# Patient Record
Sex: Female | Born: 1980 | Race: White | Hispanic: No | Marital: Married | Smoking: Current every day smoker
Health system: Southern US, Community
[De-identification: ages and names within clinical notes are randomized; demographics above are authoritative.]

## PROBLEM LIST (undated history)

## (undated) DIAGNOSIS — E785 Hyperlipidemia, unspecified: Secondary | ICD-10-CM

## (undated) DIAGNOSIS — E119 Type 2 diabetes mellitus without complications: Secondary | ICD-10-CM

## (undated) DIAGNOSIS — E11649 Type 2 diabetes mellitus with hypoglycemia without coma: Secondary | ICD-10-CM

## (undated) DIAGNOSIS — I10 Essential (primary) hypertension: Secondary | ICD-10-CM

---

## 2009-02-10 HISTORY — PX: TYMPANOSTOMY TUBE PLACEMENT: SHX32

## 2014-10-22 ENCOUNTER — Emergency Department (HOSPITAL_COMMUNITY): Payer: Medicaid Other

## 2014-10-22 ENCOUNTER — Encounter (HOSPITAL_COMMUNITY): Payer: Self-pay

## 2014-10-22 ENCOUNTER — Emergency Department (HOSPITAL_COMMUNITY)
Admission: EM | Admit: 2014-10-22 | Discharge: 2014-10-22 | Disposition: A | Discharge status: Home / Self Care | Payer: Medicaid Other | Attending: Emergency Medicine | Admitting: Emergency Medicine

## 2014-10-22 DIAGNOSIS — R9431 Abnormal electrocardiogram [ECG] [EKG]: Secondary | ICD-10-CM

## 2014-10-22 DIAGNOSIS — Z7982 Long term (current) use of aspirin: Secondary | ICD-10-CM | POA: Insufficient documentation

## 2014-10-22 DIAGNOSIS — E119 Type 2 diabetes mellitus without complications: Secondary | ICD-10-CM | POA: Insufficient documentation

## 2014-10-22 DIAGNOSIS — R079 Chest pain, unspecified: Secondary | ICD-10-CM | POA: Diagnosis not present

## 2014-10-22 DIAGNOSIS — I10 Essential (primary) hypertension: Secondary | ICD-10-CM | POA: Diagnosis not present

## 2014-10-22 DIAGNOSIS — R0602 Shortness of breath: Secondary | ICD-10-CM | POA: Insufficient documentation

## 2014-10-22 DIAGNOSIS — Z720 Tobacco use: Secondary | ICD-10-CM | POA: Insufficient documentation

## 2014-10-22 DIAGNOSIS — R0789 Other chest pain: Secondary | ICD-10-CM

## 2014-10-22 DIAGNOSIS — R42 Dizziness and giddiness: Secondary | ICD-10-CM | POA: Insufficient documentation

## 2014-10-22 HISTORY — DX: Essential (primary) hypertension: I10

## 2014-10-22 HISTORY — DX: Type 2 diabetes mellitus with hypoglycemia without coma: E11.649

## 2014-10-22 LAB — BASIC METABOLIC PANEL
ANION GAP: 12 (ref 5–15)
BUN: 13 mg/dL (ref 6–20)
CHLORIDE: 102 mmol/L (ref 101–111)
CO2: 24 mmol/L (ref 22–32)
CREATININE: 0.82 mg/dL (ref 0.44–1.00)
Calcium: 9.4 mg/dL (ref 8.9–10.3)
GFR calc non Af Amer: >60 mL/min (ref >60)
Glucose, Bld: 153 mg/dL — ABNORMAL HIGH (ref 65–99)
Potassium: 3.4 mmol/L — ABNORMAL LOW (ref 3.5–5.1)
SODIUM: 138 mmol/L (ref 135–145)

## 2014-10-22 LAB — CBC
HCT: 46.9 % — ABNORMAL HIGH (ref 36.0–46.0)
HEMOGLOBIN: 16.1 g/dL — AB (ref 12.0–15.0)
MCH: 31.4 pg (ref 26.0–34.0)
MCHC: 34.3 g/dL (ref 30.0–36.0)
MCV: 91.4 fL (ref 78.0–100.0)
PLATELETS: 323 10*3/uL (ref 150–400)
RBC: 5.13 MIL/uL — AB (ref 3.87–5.11)
RDW: 12.6 % (ref 11.5–15.5)
WBC: 11.9 10*3/uL — AB (ref 4.0–10.5)

## 2014-10-22 LAB — I-STAT TROPONIN, ED
TROPONIN I, POC: 0 ng/mL (ref 0.00–0.08)
TROPONIN I, POC: 0 ng/mL (ref 0.00–0.08)

## 2014-10-22 LAB — CBG MONITORING, ED: Glucose-Capillary: 149 mg/dL — ABNORMAL HIGH (ref 65–99)

## 2014-10-22 NOTE — ED Notes (Signed)
Patient transported to xray as soon as she arrived to room.
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
## 2014-10-22 NOTE — ED Notes (Signed)
Onset 20 minutes PTA pain at left scapula radiating through to left chest pain, diaphoretic, short of breath and nausea.  Pt has type two hypoglycemia and thought her blood sugar was dropping so she ate a candy bar.  Pt has never had chest pain with her hypoglycemia;.

## 2014-10-22 NOTE — Discharge Instructions (Signed)

## 2014-10-22 NOTE — ED Notes (Signed)
Cardiology at bedside.

## 2014-10-22 NOTE — ED Provider Notes (Signed)
CSN: [id number]     Arrival date & time 10/22/14  1211 History   First MD Initiated Contact with Patient 10/22/14 1324     Chief Complaint  Patient presents with  . Chest Pain   HPI  Ms. [place] is a 34 year old female with PMHx of DM2 and HTN presenting with chest pain. Pt states she was visiting her son who is currently in the PICU when she felt lightheaded, nauseous and diaphoretic. She thought she was having a hypoglycemic episode so she ate a candy bar without resolution of symptoms. Pt then reports onset of chest pain that radiated through to her back with associated shortness of breath. Pt states she feels like "she's being punched straight through the chest". Pain is intermittent in severity and increases with deep inspiration and movement. Denies current SOB. Pt also complaining of left arm numbness and tingling that radiates down to the fingertips and up into the neck. Pt states she had been seeing a cardiologist in [place] for "abnormal EKGs" and was scheduled for a stress test but never went. She is unsure what was abnormal about her EKGs. Her mother died of "heart disease" in her 85s.   Past Medical History  Diagnosis Date  . Type 2 diabetes mellitus with hypoglycemia   . Hypertension    History reviewed. No pertinent past surgical history. History reviewed. No pertinent family history. Social History  Substance Use Topics  . Smoking status: Current Every Day Smoker -- 0.50 packs/day    Types: Cigarettes  . Smokeless tobacco: None  . Alcohol Use: No   OB History    No data available     Review of Systems  Constitutional: Positive for diaphoresis. Negative for fever and chills.  Respiratory: Positive for shortness of breath.   Cardiovascular: Positive for chest pain.  Gastrointestinal: Positive for nausea. Negative for vomiting and abdominal pain.  Musculoskeletal: Negative for myalgias and arthralgias.  Neurological: Positive for dizziness, weakness, light-headedness  and numbness. Negative for syncope and headaches.      Allergies  Tomato  Home Medications   Prior to Admission medications   Medication Sig Start Date End Date Taking? Authorizing Provider  Artificial Tear Ointment (DRY EYES OP) Apply 1 drop to eye 4 (four) times daily.   Yes Historical Provider, MD  aspirin-acetaminophen-caffeine (EXCEDRIN MIGRAINE) [phone] MG per tablet Take 3 tablets by mouth every 4 (four) hours as needed for migraine.   Yes Historical Provider, MD  HYDROcodone-acetaminophen (NORCO) 10-325 MG per tablet Take 1 tablet by mouth every 6 (six) hours as needed for moderate pain.   Yes Historical Provider, MD  oxymetazoline (AFRIN) 0.05 % nasal spray Place 2 sprays into both nostrils 2 (two) times daily.   Yes Historical Provider, MD   BP 125/82 mmHg  Pulse 70  Temp(Src) 97.9 F (36.6 C) (Oral)  Resp 9  Ht  (1.676 m)  Wt 153 lb 4.8 oz (69.536 kg)  BMI 24.75 kg/m2  SpO2 99%  LMP 09/20/2014 Physical Exam  Constitutional: She is oriented to person, place, and time. She appears well-developed and well-nourished. No distress.  HENT:  Head: Normocephalic.  Neck: Normal range of motion.  Cardiovascular: Normal rate, regular rhythm and normal heart sounds.   No murmur heard. Pedal pulses palpable Cap refill < 3   Pulmonary/Chest: Effort normal and breath sounds normal. No respiratory distress. She has no wheezes. She has no rales.  Abdominal: Soft. There is no tenderness.  Musculoskeletal: Normal range of motion.  Neurological: She is alert and oriented to person, place, and time.  5/5 motor strength of upper and lower extremities. Sensation to light touch intact throughout.   Skin: Skin is warm and dry.  Psychiatric: She has a normal mood and affect. Her behavior is normal.  Nursing note and vitals reviewed.   ED Course  Procedures (including critical care time) Labs Review Labs Reviewed  BASIC METABOLIC PANEL - Abnormal; Notable for the following:     Potassium 3.4 (*)    Glucose, Bld 153 (*)    All other components within normal limits  CBC - Abnormal; Notable for the following:    WBC 11.9 (*)    RBC 5.13 (*)    Hemoglobin 16.1 (*)    HCT 46.9 (*)    All other components within normal limits  CBG MONITORING, ED - Abnormal; Notable for the following:    Glucose-Capillary 149 (*)    All other components within normal limits  CBG MONITORING, ED  I-STAT TROPOININ, ED  Rosezena Sensor, ED    Imaging Review Dg Chest 2 View  10/22/2014   CLINICAL DATA:  Midsternal and left-sided chest pain.  EXAM: CHEST  2 VIEW  COMPARISON:  None.  FINDINGS: The heart size and mediastinal contours are within normal limits. Both lungs are clear. The visualized skeletal structures are unremarkable.  IMPRESSION: No active cardiopulmonary disease.   Electronically Signed   By: Elsie Stain M.D.   On: 10/22/2014 14:04   I have personally reviewed and evaluated these images and lab results as part of my medical decision-making.   EKG Interpretation   Date/Time:  Sunday October 22 2014 12:16:48 EDT Ventricular Rate:  107 PR Interval:  118 QRS Duration: 94 QT Interval:  358 QTC Calculation: 477 R Axis:   85 Text Interpretation:  Sinus tachycardia Right atrial enlargement Cannot  rule out Inferior infarct , age undetermined Baseline wander No old  tracing to compare Confirmed by [facility]  MD, Nicholos Johns [phone]) on 10/22/2014  1:39:45 PM      MDM   Final diagnoses:  Chest pain, unspecified chest pain type   Pt presenting with chest pain. Pain associated with nausea, diaphoresis, SOB and lightheadedness. Pt thought she was having hypoglycemic event and ate candy bar with no resolution in symptoms. Pt reports resolution of almost all symptoms except for chest pain. She says it is not as severe, she feels "sore". VSS. Pt nontoxic on exam. Heart RRR. Lungs CTAB. Answers questions appropriately. CXR normal. EKG shows possible old inferior infarct.  Consulted cardiology who saw her in ED. Dr. Patty Sermons believes CP is manifestation of pt's hypoglycemia. Recommends repeat troponin 3 hours after original draw and outpatient follow up for echo. Pt repeat troponin 0.00. Pt given referral information for [facility] and instructed to call and schedule appointment. Return precautions given in discharge paperwork and discussed with pt. Pt stable for discharge.    Alveta Heimlich, PA-C 10/22/14 1851  Samuel Jester, DO 10/25/14 1635

## 2014-10-22 NOTE — ED Notes (Signed)
Pt was in hospital when chest pain started, her son is in ICU.

## 2014-11-05 ENCOUNTER — Emergency Department (HOSPITAL_COMMUNITY): Payer: Medicaid Other

## 2014-11-05 ENCOUNTER — Encounter (HOSPITAL_COMMUNITY): Payer: Self-pay | Admitting: *Deleted

## 2014-11-05 ENCOUNTER — Emergency Department (HOSPITAL_COMMUNITY)
Admission: EM | Admit: 2014-11-05 | Discharge: 2014-11-05 | Disposition: A | Discharge status: Home / Self Care | Payer: Medicaid Other | Attending: Emergency Medicine | Admitting: Emergency Medicine

## 2014-11-05 DIAGNOSIS — R630 Anorexia: Secondary | ICD-10-CM | POA: Diagnosis not present

## 2014-11-05 DIAGNOSIS — I10 Essential (primary) hypertension: Secondary | ICD-10-CM | POA: Insufficient documentation

## 2014-11-05 DIAGNOSIS — Z79899 Other long term (current) drug therapy: Secondary | ICD-10-CM | POA: Insufficient documentation

## 2014-11-05 DIAGNOSIS — Z3202 Encounter for pregnancy test, result negative: Secondary | ICD-10-CM | POA: Insufficient documentation

## 2014-11-05 DIAGNOSIS — R1011 Right upper quadrant pain: Secondary | ICD-10-CM | POA: Insufficient documentation

## 2014-11-05 DIAGNOSIS — E119 Type 2 diabetes mellitus without complications: Secondary | ICD-10-CM | POA: Insufficient documentation

## 2014-11-05 DIAGNOSIS — Z720 Tobacco use: Secondary | ICD-10-CM | POA: Insufficient documentation

## 2014-11-05 LAB — CBC WITH DIFFERENTIAL/PLATELET
BASOS PCT: 0 %
Basophils Absolute: 0 10*3/uL (ref 0.0–0.1)
EOS ABS: 0.2 10*3/uL (ref 0.0–0.7)
Eosinophils Relative: 2 %
HEMATOCRIT: 45.9 % (ref 36.0–46.0)
HEMOGLOBIN: 15.5 g/dL — AB (ref 12.0–15.0)
Lymphocytes Relative: 23 %
Lymphs Abs: 3.2 10*3/uL (ref 0.7–4.0)
MCH: 31.1 pg (ref 26.0–34.0)
MCHC: 33.8 g/dL (ref 30.0–36.0)
MCV: 92 fL (ref 78.0–100.0)
MONOS PCT: 6 %
Monocytes Absolute: 0.8 10*3/uL (ref 0.1–1.0)
NEUTROS ABS: 9.5 10*3/uL — AB (ref 1.7–7.7)
NEUTROS PCT: 69 %
Platelets: 303 10*3/uL (ref 150–400)
RBC: 4.99 MIL/uL (ref 3.87–5.11)
RDW: 12.7 % (ref 11.5–15.5)
WBC: 13.8 10*3/uL — AB (ref 4.0–10.5)

## 2014-11-05 LAB — COMPREHENSIVE METABOLIC PANEL
ALBUMIN: 4.1 g/dL (ref 3.5–5.0)
ALK PHOS: 56 U/L (ref 38–126)
ALT: 30 U/L (ref 14–54)
AST: 18 U/L (ref 15–41)
Anion gap: 4 — ABNORMAL LOW (ref 5–15)
BILIRUBIN TOTAL: 0.3 mg/dL (ref 0.3–1.2)
BUN: 18 mg/dL (ref 6–20)
CALCIUM: 8.7 mg/dL — AB (ref 8.9–10.3)
CO2: 26 mmol/L (ref 22–32)
CREATININE: 0.59 mg/dL (ref 0.44–1.00)
Chloride: 111 mmol/L (ref 101–111)
GFR calc Af Amer: >60 mL/min (ref >60)
GFR calc non Af Amer: >60 mL/min (ref >60)
GLUCOSE: 84 mg/dL (ref 65–99)
Potassium: 4 mmol/L (ref 3.5–5.1)
SODIUM: 141 mmol/L (ref 135–145)
TOTAL PROTEIN: 7.5 g/dL (ref 6.5–8.1)

## 2014-11-05 LAB — URINALYSIS, ROUTINE W REFLEX MICROSCOPIC
BILIRUBIN URINE: NEGATIVE
GLUCOSE, UA: NEGATIVE mg/dL
Ketones, ur: NEGATIVE mg/dL
Leukocytes, UA: NEGATIVE
Nitrite: NEGATIVE
PH: 5.5 (ref 5.0–8.0)
Protein, ur: NEGATIVE mg/dL
SPECIFIC GRAVITY, URINE: 1.02 (ref 1.005–1.030)
UROBILINOGEN UA: 0.2 mg/dL (ref 0.0–1.0)

## 2014-11-05 LAB — URINE MICROSCOPIC-ADD ON

## 2014-11-05 LAB — LIPASE, BLOOD: Lipase: 26 U/L (ref 22–51)

## 2014-11-05 LAB — POC URINE PREG, ED: PREG TEST UR: NEGATIVE

## 2014-11-05 MED ORDER — SODIUM CHLORIDE 0.9 % IV BOLUS (SEPSIS)
1000.0000 mL | Freq: Once | INTRAVENOUS | Status: AC
  Administered 2014-11-05: 1000 mL via INTRAVENOUS

## 2014-11-05 MED ORDER — MORPHINE SULFATE (PF) 4 MG/ML IV SOLN
4.0000 mg | Freq: Once | INTRAVENOUS | Status: AC
  Administered 2014-11-05: 4 mg via INTRAVENOUS
  Filled 2014-11-05: qty 1

## 2014-11-05 MED ORDER — HYDROCODONE-ACETAMINOPHEN 5-325 MG PO TABS: 1.0000 | ORAL_TABLET | ORAL | Status: DC | PRN

## 2014-11-05 MED ORDER — ONDANSETRON HCL 4 MG/2ML IJ SOLN
4.0000 mg | Freq: Once | INTRAMUSCULAR | Status: AC
  Administered 2014-11-05: 4 mg via INTRAVENOUS
  Filled 2014-11-05: qty 2

## 2014-11-05 MED ORDER — PROMETHAZINE HCL 25 MG PO TABS
25.0000 mg | ORAL_TABLET | Freq: Four times a day (QID) | ORAL | Status: DC | PRN
Start: 2014-11-05 — End: 2015-06-16

## 2014-11-05 MED ORDER — IOHEXOL 300 MG/ML  SOLN
25.0000 mL | Freq: Once | INTRAMUSCULAR | Status: AC | PRN
  Administered 2014-11-05: 25 mL via ORAL

## 2014-11-05 MED ORDER — IOHEXOL 300 MG/ML  SOLN
100.0000 mL | Freq: Once | INTRAMUSCULAR | Status: AC | PRN
  Administered 2014-11-05: 100 mL via INTRAVENOUS

## 2014-11-05 MED ORDER — IOHEXOL 300 MG/ML  SOLN: 25.0000 mL | Freq: Once | INTRAMUSCULAR | Status: DC | PRN

## 2014-11-05 NOTE — ED Notes (Signed)
RUQ pain x 1 week. Pain is worse after eating. Nausea.

## 2014-11-05 NOTE — Discharge Instructions (Signed)
We will schedule gallbladder ultrasound for tomorrow. Medication for pain and nausea. Avoid rich greasy foods. Need to find a primary care physician. Resource guide given.    Emergency Department Resource Guide 1) Find a Doctor and Pay Out of Pocket Although you won't have to find out who is covered by your insurance plan, it is a good idea to ask around and get recommendations. You will then need to call the office and see if the doctor you have chosen will accept you as a new patient and what types of options they offer for patients who are self-pay. Some doctors offer discounts or will set up payment plans for their patients who do not have insurance, but you will need to ask so you aren't surprised when you get to your appointment.  2) Contact Your Local Health Department Not all health departments have doctors that can see patients for sick visits, but many do, so it is worth a call to see if yours does. If you don't know where your local health department is, you can check in your phone book. The CDC also has a tool to help you locate your state's health department, and many state websites also have listings of all of their local health departments.  3) Find a Walk-in Clinic If your illness is not likely to be very severe or complicated, you may want to try a walk in clinic. These are popping up all over the country in pharmacies, drugstores, and shopping centers. They're usually staffed by nurse practitioners or physician assistants that have been trained to treat common illnesses and complaints. They're usually fairly quick and inexpensive. However, if you have serious medical issues or chronic medical problems, these are probably not your best option.  No Primary Care Doctor: - Call Health Connect at  [phone] - they can help you locate a primary care doctor that  accepts your insurance, provides certain services, etc. - Physician Referral Service- [phone]  Chronic Pain  Problems: Organization         Address  Phone   Notes  [facility]  [phone] Patients need to be referred by their primary care doctor.   Medication Assistance: Organization         Address  Phone   Notes  [facility] Medication [facility] [address] [place], [place] [address] [phone] --Must be a resident of [facility] -- Must have NO insurance coverage whatsoever (no Medicaid/ Medicare, etc.) -- The pt. MUST have a primary care doctor that directs their care regularly and follows them in the community   [facility]  [phone]   [employer]  [phone]    Agencies that provide inexpensive medical care: Organization         Address  Phone   Notes  [facility]  [phone]   [facility]    [phone]   [facility] [address] [place], [place] [address] [phone]   [facility] [address] [place], [place] [phone]   [facility]anned Parenthood    [phone]   [facility]    [phone]   [facility]  [address], [place] Phone:  [phone], Fax:  [phone] Hours of Operation:  9 am - 6 pm, M-F.  Also accepts Medicaid/Medicare and self-pay.  [facility]  [address], [place] Phone: [phone], Fax: [phone]. Hours of Operation:  8:30 am - 5:30 pm, M-F.  Also accepts Medicaid and self-pay.  [facility] [place] [address] [place] Phone: [phone]   [place], [place], [place] [phone], Ext. [phone] Mondays & Thursdays: 7-9 AM.  First 15 patients are seen on a first come, first serve basis.    [place] Providers:  Organization         Address  Phone   Notes  [facility] [address], [place] [phone] Also  accepts self-pay patients.  [facility] [address], [place]  [phone]   [place], [address], [place] [phone]   [facility] [address] [place] [phone]   Lucianne Lei [address], [place]   [phone] Only accepts [place] Access [place] patients after they have their name applied to their card.   Self-Pay (no insurance) in [facility]:  Organization         Address  Phone   Notes  Sickle Cell Patients, [facility] [place] [phone]   [facility] [place] [phone]   [facility] [place]  [place], [place], [place] [phone]   [facility]/Dr. Osei-Bonsu  [address] [place] or [address], [place] [phone] Phone number for both [place] and [place] locations is the same.  [facility] [address] [place] [phone]   [facility] [address] [place] or [address] [phone]   [facility] [address] [place] [phone], phone; [phone], fax Sees patients 1st and 3rd Saturday of every month.  Must not qualify for public or private insurance (i.e. Medicaid, Medicare, [place] Health Choice, Veterans' Benefits)  Household income should be no more than 200% of the poverty level The clinic cannot treat you if you are pregnant or think you are pregnant  Sexually transmitted diseases are not treated at the clinic.    Dental Care: Organization         Address  Phone  Notes  [facility] [phone] Accepts children up to age 80 who are enrolled in [place] or [place]; pregnant  women with a Medicaid card; and children who have applied for Medicaid or [place] Health Choice, but were declined, whose parents can pay a reduced fee at time of service.  [facility] Department of [facility]  [address], [place] [phone] Accepts children up to age 55 who are enrolled in [place] or [place]; pregnant women with a Medicaid card; and children who have applied for Medicaid or [place] Health Choice, but were declined, whose parents can pay a reduced fee at time of service.  [facility]  [place] [phone] Patients are seen by appointment only. Walk-ins are not accepted. [place] will see patients 4 years of age and older. Monday - Tuesday (8am-5pm) Most Wednesdays (8:30-5pm) $30 per visit, cash only  [place]  [address], [place] [phone] Patients are seen by appointment only. Walk-ins are not accepted. [facility] will see patients 18 years of age and older. °One Wednesday Evening (Monthly: Volunteer Based).  $30 per visit, cash only  °[facility]  [phone] for adults; Children under age 4, call Graduate Pediatric Dentistry at [phone]. Children aged 4-14, please call [phone] to request a pediatric application. ° Dental services are provided in all areas of dental care including fillings, crowns and bridges, complete and partial dentures, implants, gum treatment, root canals, and extractions. Preventive care is also provided. Treatment is provided to both adults and children. °Patients are selected via a lottery and there is often a waiting list. °  °[facility] [address], °[place] ° ([phone] [email or web address] °  °[facility] [address], [place], [place] [phone], Ext. [phone] Second and Fourth Thursday of each month, opens at 6:30 AM; Clinic ends at 9 AM.  Patients are  seen on a first-come first-served basis, and a limited number are seen during each clinic.  ° °[facility] ° [address], [place], [place] [phone]   Eligibility Requirements °You must have lived in [place], [place], or [place] counties for at least the last three months. °  You cannot be eligible for state or federal sponsored healthcare insurance, including Veterans Administration, Medicaid, or Medicare. °  You generally cannot be eligible for healthcare insurance through your employer.  °  How to apply: °Eligibility screenings are held every Tuesday and Wednesday afternoon from 1:00 pm until 4:00 pm. You do not need an appointment for the interview!  °[facility] [address], [place], [place] [phone]   °[facility]  [phone]   °[facility]  [phone]   °[facility]  336-570-6415   ° °Behavioral Health Resources in the Community: °Intensive Outpatient Programs °Organization         Address  Phone  Notes  °[facility] [address], [place], [place] [phone]   °[facility] [address], [place], [place] [phone]   °[facility] [address], [place], [place] ° 336-882-2125   °[facility] [address],  °[place], [place] 1-800-853-5163 or 336-641-4981   °Substance Abuse Resources °Organization         Address  Phone  Notes  °[facility]  336-882-2125   °[facility]  336-784-9470   °[facility]  336-285-9073   °[facility]  336-845-3988   °Residential & Outpatient Substance Abuse Program  1-800-659-3381   °Psychological Services °Organization         Address  Phone  Notes  °[facility]  336- [phone]   °[facility]  336- 378-7881   °[facility] [address], [place] 1-800-853-5163 or 336-641-4981   ° °Mobile Crisis  Teams °Organization         Address  Phone  Notes  °[facility], Mobile Crisis Care Unit  1-877-626-1772   °Assertive °[facility] ° [address] [place], [place] 336-834-9664   °Sharon DeEsch [address] °[place] 336-554-5454   ° °Self-Help/Support Groups °Organization         Address  Phone             Notes  °[facility] [place] -   variety of support groups  336- 373-1402 Call for more information  °Narcotics Anonymous (NA), [facility] [address], °[place]  2 meetings at this location  ° °Residential Treatment Programs °Organization         Address  Phone  Notes  °[facility] [address],    °[place]  1-866-801-8205   °[facility] ° [address], [place], [place] [phone]   °[facility] [address], [place] 336-845-3988 Admissions: 8am-3pm M-F  °[facility] [address]    °[place], [place] 336-841-1104   °[facility] [address], [place], [place] 336-379-7146   °[facility] [address]  °[place], [place] 336-285-9073   °[facility] Intensive Outpatient [address], [place], [place] 336-852-3033   °[facility] [address]  °[place], [place] 1-877-615-2722 or 336-784-9470   °[facility] ([facility]) [address] [place], [place] 336-227-7417 Accepts Medicaid  °[facility] [address]  °[place] 1-800-659-3381 Substance Abuse/Addiction Treatment  ° °[place] [facility] Resources °Organization         Address  Phone  Notes  °[facility]  (888) 581-9988   °Julie Brannon, PhD [address] [place], [place]   (336) 349-5553 or (336) 951-0000   °[facility]   [address] °[place], [place] (336) 349-4454   °[facility] [address], [place], [place] (336) 342-8316  Insurance/Medicaid/sponsorship through [facility]  °[facility] [address]                                    [place], [place] (336) [phone] Therapy/tele-psych/case  °[facility] [address]  ° [place], [place] (336) 349-2233    °Dr. Arfeen  (336) 349-4544   °[facility] [place]  [employer] [place] [facility] 1) [address],  °2) [address], [place] °3)  [address], [place] (336) 349-3220 °(336) 342-7768 ° °(336) 342-8140   °[place] Child Abuse Hotline (336) 342-1394 or (336) 342-3537 (After Hours)    ° ° °

## 2014-11-05 NOTE — ED Provider Notes (Signed)
CSN: [id number]     Arrival date & time 11/05/14  1535 History   First MD Initiated Contact with Patient 11/05/14 1634     Chief Complaint  Patient presents with  . Abdominal Pain     (Consider location/radiation/quality/duration/timing/severity/associated sxs/prior Treatment) HPI .Marland Kitchen... Right upper quadrant pain for several days with questionable radiation to the flank. No chest pain, dyspnea, dysuria, hematuria or fever, sweats, chills, nausea, vomiting, diarrhea. Family history positive for cholelithiasis. Decreased appetite. Patient apparently has lost 35 pounds in the past 2 months. Her son was recently diagnosed with type 1 diabetes. Past Medical History  Diagnosis Date  . Type 2 diabetes mellitus with hypoglycemia   . Hypertension    History reviewed. No pertinent past surgical history. No family history on file. Social History  Substance Use Topics  . Smoking status: Current Every Day Smoker -- 0.50 packs/day    Types: Cigarettes  . Smokeless tobacco: None  . Alcohol Use: No   OB History    No data available     Review of Systems  All other systems reviewed and are negative.     Allergies  Tomato  Home Medications   Prior to Admission medications   Medication Sig Start Date End Date Taking? Authorizing Provider  Artificial Tear Ointment (DRY EYES OP) Apply 2 drops to eye 3 (three) times daily.    Yes Historical Provider, MD  aspirin-acetaminophen-caffeine (EXCEDRIN MIGRAINE) [phone] MG per tablet Take 2-3 tablets by mouth every 4 (four) hours as needed for migraine.    Yes Historical Provider, MD  oxymetazoline (AFRIN) 0.05 % nasal spray Place 2 sprays into both nostrils 2 (two) times daily.   Yes Historical Provider, MD  HYDROcodone-acetaminophen (NORCO/VICODIN) 5-325 MG per tablet Take 1-2 tablets by mouth every 4 (four) hours as needed. 11/05/14   Donnetta Hutching, MD  promethazine (PHENERGAN) 25 MG tablet Take 1 tablet (25 mg total) by mouth every 6 (six) hours as  needed. 11/05/14   Donnetta Hutching, MD   BP 112/76 mmHg  Pulse 77  Temp(Src) 98.2 F (36.8 C) (Oral)  Resp 18  Ht  (1.676 m)  Wt 153 lb 6.4 oz (69.582 kg)  BMI 24.77 kg/m2  SpO2 94%  LMP 11/05/2014 Physical Exam  Constitutional: She is oriented to person, place, and time. She appears well-developed and well-nourished.  HENT:  Head: Normocephalic and atraumatic.  Eyes: Conjunctivae and EOM are normal. Pupils are equal, round, and reactive to light.  Neck: Normal range of motion. Neck supple.  Cardiovascular: Normal rate and regular rhythm.   Pulmonary/Chest: Effort normal and breath sounds normal.  Abdominal: Soft. Bowel sounds are normal.  Minimal right upper quadrant tenderness  Musculoskeletal: Normal range of motion.  Neurological: She is alert and oriented to person, place, and time.  Skin: Skin is warm and dry.  Psychiatric: She has a normal mood and affect. Her behavior is normal.  Nursing note and vitals reviewed.   ED Course  Procedures (including critical care time) Labs Review Labs Reviewed  CBC WITH DIFFERENTIAL/PLATELET - Abnormal; Notable for the following:    WBC 13.8 (*)    Hemoglobin 15.5 (*)    Neutro Abs 9.5 (*)    All other components within normal limits  COMPREHENSIVE METABOLIC PANEL - Abnormal; Notable for the following:    Calcium 8.7 (*)    Anion gap 4 (*)    All other components within normal limits  URINALYSIS, ROUTINE W REFLEX MICROSCOPIC (NOT AT [facility]) - Abnormal;  Notable for the following:    Hgb urine dipstick LARGE (*)    All other components within normal limits  LIPASE, BLOOD  URINE MICROSCOPIC-ADD ON  POC URINE PREG, ED    Imaging Review Ct Abdomen Pelvis W Contrast  11/05/2014   CLINICAL DATA:  Right upper quadrant pain for 1 week, worse after eating. Nausea.  EXAM: CT ABDOMEN AND PELVIS WITH CONTRAST  TECHNIQUE: Multidetector CT imaging of the abdomen and pelvis was performed using the standard protocol following bolus  administration of intravenous contrast.  CONTRAST:  1 OMNIPAQUE IOHEXOL 300 MG/ML SOLN, 25mL OMNIPAQUE IOHEXOL 300 MG/ML SOLN, OMNIPAQUE IOHEXOL 300 MG/ML SOLN  COMPARISON:  None.  FINDINGS: Dependent subsegmental atelectasis is present in the lung bases. There is no pleural effusion.  A 5 mm hypoattenuating lesion in the right hepatic dome is too small to fully characterize. The gallbladder, spleen, adrenal glands, kidneys, and pancreas have an unremarkable enhanced appearance. There is no biliary dilatation.  Oral contrast is present in multiple nondilated loops of proximal and mid small bowel. There is no evidence of bowel obstruction. Appendix is unremarkable. No gross bowel wall thickening is seen. A 4 mm hyperdense focus adjacent to the ascending colon may reflect calcification related to remote inflammation, such as a prior episode of epiploic appendagitis, or could reflect high density material within a small diverticulum. No is associated inflammatory changes seen.  Bladder, uterus, and ovaries are unremarkable. Minimal atherosclerotic aortic calcification is noted. No free fluid or enlarged lymph nodes are identified. A punctate sclerotic focus in the T9 vertebral body likely represents a bone island.  IMPRESSION: No acute abnormality identified in the abdomen or pelvis. If the patient has ongoing right upper quadrant pain and gallbladder pathology is a clinical concern, consider further evaluation with right upper quadrant ultrasound.   Electronically Signed   By: Sebastian Ache M.D.   On: 11/05/2014 18:59   I have personally reviewed and evaluated these images and lab results as part of my medical decision-making.   EKG Interpretation None      MDM   Final diagnoses:  RUQ pain    Patient is hemodynamically stable. Liver functions and lipase normal. Hemoglobin noted in urine. CT scan of abdomen and pelvis negative for acute findings. Will order out patient ultrasound of right upper  quadrant rule out cholelithiasis. Discharge medications Norco and Phenergan 25 mg.    Donnetta Hutching, MD 11/05/14 2207

## 2014-11-06 ENCOUNTER — Other Ambulatory Visit (HOSPITAL_COMMUNITY): Payer: Self-pay | Admitting: Emergency Medicine

## 2014-11-06 ENCOUNTER — Ambulatory Visit (HOSPITAL_COMMUNITY): Admit: 2014-11-06 | Payer: Self-pay

## 2014-11-06 DIAGNOSIS — R1011 Right upper quadrant pain: Secondary | ICD-10-CM

## 2014-12-09 ENCOUNTER — Encounter (HOSPITAL_COMMUNITY): Payer: Self-pay | Admitting: *Deleted

## 2014-12-09 ENCOUNTER — Emergency Department (HOSPITAL_COMMUNITY)
Admission: EM | Admit: 2014-12-09 | Discharge: 2014-12-09 | Disposition: A | Discharge status: Home / Self Care | Payer: Medicaid Other | Attending: Emergency Medicine | Admitting: Emergency Medicine

## 2014-12-09 DIAGNOSIS — N390 Urinary tract infection, site not specified: Secondary | ICD-10-CM | POA: Insufficient documentation

## 2014-12-09 DIAGNOSIS — E119 Type 2 diabetes mellitus without complications: Secondary | ICD-10-CM | POA: Insufficient documentation

## 2014-12-09 DIAGNOSIS — Z79899 Other long term (current) drug therapy: Secondary | ICD-10-CM | POA: Diagnosis not present

## 2014-12-09 DIAGNOSIS — I10 Essential (primary) hypertension: Secondary | ICD-10-CM | POA: Diagnosis not present

## 2014-12-09 DIAGNOSIS — Z720 Tobacco use: Secondary | ICD-10-CM | POA: Insufficient documentation

## 2014-12-09 DIAGNOSIS — R319 Hematuria, unspecified: Secondary | ICD-10-CM | POA: Diagnosis present

## 2014-12-09 DIAGNOSIS — Z792 Long term (current) use of antibiotics: Secondary | ICD-10-CM | POA: Insufficient documentation

## 2014-12-09 LAB — URINALYSIS, ROUTINE W REFLEX MICROSCOPIC
Bilirubin Urine: NEGATIVE
Glucose, UA: NEGATIVE mg/dL
Ketones, ur: NEGATIVE mg/dL
Nitrite: NEGATIVE
PROTEIN: 30 mg/dL — AB
UROBILINOGEN UA: 0.2 mg/dL (ref 0.0–1.0)
pH: 5.5 (ref 5.0–8.0)

## 2014-12-09 LAB — URINE MICROSCOPIC-ADD ON

## 2014-12-09 MED ORDER — CEPHALEXIN 500 MG PO CAPS: 500.0000 mg | ORAL_CAPSULE | Freq: Four times a day (QID) | ORAL | Status: DC

## 2014-12-09 NOTE — ED Provider Notes (Signed)
CSN: [id number]     Arrival date & time 12/09/14  1804 History   First MD Initiated Contact with Patient 12/09/14 1837     Chief Complaint  Patient presents with  . urinary problem      (Consider location/radiation/quality/duration/timing/severity/associated sxs/prior Treatment) HPI   Heather Richard is a 34 y.o. female who presents for evaluation of urinary urgency, frequency and hematuria. Onset of symptoms 3 weeks ago. She has associated nausea without vomiting. She denies fever, chills, weakness or dizziness. She has also seeing "crystals" in her urine. There are no other known modifying factors.   Past Medical History  Diagnosis Date  . 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 Hypertension    History reviewed. No pertinent past surgical history. No family history on file. Social History  Substance Use Topics  . Smoking status: Current Every Day Smoker -- 0.50 packs/day    Types: Cigarettes  . Smokeless tobacco: None  . Alcohol Use: No   OB History    No data available     Review of Systems  All other systems reviewed and are negative.     Allergies  Tomato and Tylenol  Home Medications   Prior to Admission medications   Medication Sig Start Date End Date Taking? Authorizing Provider  Artificial Tear Ointment (DRY EYES OP) Apply 2 drops to eye 3 (three) times daily.    Yes Historical Provider, MD  aspirin-acetaminophen-caffeine (EXCEDRIN MIGRAINE) [phone] MG per tablet Take 2-3 tablets by mouth every 4 (four) hours as needed for migraine.    Yes Historical Provider, MD  clindamycin (CLEOCIN) 300 MG capsule Take 300 mg by mouth 3 (three) times daily.   Yes Historical Provider, MD  oxymetazoline (AFRIN) 0.05 % nasal spray Place 2 sprays into both nostrils 2 (two) times daily.   Yes Historical Provider, MD  cephALEXin (KEFLEX) 500 MG capsule Take 1 capsule (500 mg total) by mouth 4 (four) times daily. 12/09/14   Mancel Bale, MD   HYDROcodone-acetaminophen (NORCO/VICODIN) 5-325 MG per tablet Take 1-2 tablets by mouth every 4 (four) hours as needed. Patient not taking: Reported on 12/09/2014 11/05/14   Donnetta Hutching, MD  promethazine (PHENERGAN) 25 MG tablet Take 1 tablet (25 mg total) by mouth every 6 (six) hours as needed. Patient not taking: Reported on 12/09/2014 11/05/14   Donnetta Hutching, MD   BP 139/92 mmHg  Pulse 86  Temp(Src) 97.7 F (36.5 C) (Oral)  Resp 20  Ht  (1.676 m)  Wt 148 lb (67.132 kg)  BMI 23.90 kg/m2  SpO2 99%  LMP 11/24/2014 Physical Exam  Constitutional: She is oriented to person, place, and time. She appears well-developed and well-nourished.  HENT:  Head: Normocephalic and atraumatic.  Right Ear: External ear normal.  Left Ear: External ear normal.  Eyes: Conjunctivae and EOM are normal. Pupils are equal, round, and reactive to light.  Neck: Normal range of motion and phonation normal. Neck supple.  Cardiovascular: Normal rate, regular rhythm and normal heart sounds.   Pulmonary/Chest: Effort normal and breath sounds normal. She exhibits no bony tenderness.  Abdominal: Soft. There is no tenderness.  Genitourinary:  Mild right costovertebral angle tenderness.  Musculoskeletal: Normal range of motion.  Neurological: She is alert and oriented to person, place, and time. No cranial nerve deficit or sensory deficit. She exhibits normal muscle tone. Coordination normal.  Skin: Skin is warm, dry and intact.  Psychiatric: She has a normal mood and affect. Her behavior is normal. Judgment  and thought content normal.  Nursing note and vitals reviewed.   ED Course  Procedures (including critical care time)  Medications - No data to display  Patient Vitals for the past 24 hrs:  BP Temp Temp src Pulse Resp SpO2 Height Weight  12/09/14 1818 139/92 mmHg 97.7 F (36.5 C) Oral 86 20 99 % 5\' 6"  (1.676 m) 148 lb (67.132 kg)    At discharge- Reevaluation with update and discussion. After initial  assessment and treatment, an updated evaluation reveals no change in clinical exam. Findings discussed with the patient, all questions were answered. Jalexis Breed L    Labs Review Labs Reviewed  URINALYSIS, ROUTINE W REFLEX MICROSCOPIC (NOT AT [facility]) - Abnormal; Notable for the following:    APPearance HAZY (*)    Specific Gravity, Urine >1.030 (*)    Hgb urine dipstick LARGE (*)    Protein, ur 30 (*)    Leukocytes, UA TRACE (*)    All other components within normal limits  URINE MICROSCOPIC-ADD ON - Abnormal; Notable for the following:    Squamous Epithelial / LPF FEW (*)    Bacteria, UA FEW (*)    All other components within normal limits    Imaging Review No results found. I have personally reviewed and evaluated these images and lab results as part of my medical decision-making.   EKG Interpretation None      MDM   Final diagnoses:  Urinary tract infection without hematuria, site unspecified    Evaluation consistent with simple urinary tract infection/cystitis. Doubt serous bacterial infection. Metabolic instability or impending vascular collapse.   Nursing Notes Reviewed/ Care Coordinated Applicable Imaging Reviewed Interpretation of Laboratory Data incorporated into ED treatment  The patient appears reasonably screened and/or stabilized for discharge and I doubt any other medical condition or other [facility] requiring further screening, evaluation, or treatment in the ED at this time prior to discharge.  Plan: Home Medications- Keflex; Home Treatments- rest; return here if the recommended treatment, does not improve the symptoms; Recommended follow up- PCP prn    Mancel BaleElliott Clearence Vitug, MD 12/10/14 [phone]

## 2014-12-09 NOTE — ED Notes (Signed)
Pt states she has been having trouble urinating and is frequently having accidents. Pt can go to the bathroom but feels "pressure" after she has finished urinating. Pt states lower abdomen distention.  Pt notes mild pain in flank area. NAD noted. Pt states she has had nausea, denies vomiting.

## 2014-12-09 NOTE — Discharge Instructions (Signed)
Drink plenty of fluids. Use Tylenol for fever or pain. Follow-up with a primary care doctor if you're not better in 5-7 days.   Urinary Tract Infection Urinary tract infections (UTIs) can develop anywhere along your urinary tract. Your urinary tract is your body's drainage system for removing wastes and extra water. Your urinary tract includes two kidneys, two ureters, a bladder, and a urethra. Your kidneys are a pair of bean-shaped organs. Each kidney is about the size of your fist. They are located below your ribs, one on each side of your spine. CAUSES Infections are caused by microbes, which are microscopic organisms, including fungi, viruses, and bacteria. These organisms are so small that they can only be seen through a microscope. Bacteria are the microbes that most commonly cause UTIs. SYMPTOMS  Symptoms of UTIs may vary by age and gender of the patient and by the location of the infection. Symptoms in young women typically include a frequent and intense urge to urinate and a painful, burning feeling in the bladder or urethra during urination. Older women and men are more likely to be tired, shaky, and weak and have muscle aches and abdominal pain. A fever may mean the infection is in your kidneys. Other symptoms of a kidney infection include pain in your back or sides below the ribs, nausea, and vomiting. DIAGNOSIS To diagnose a UTI, your caregiver will ask you about your symptoms. Your caregiver will also ask you to provide a urine sample. The urine sample will be tested for bacteria and white blood cells. White blood cells are made by your body to help fight infection. TREATMENT  Typically, UTIs can be treated with medication. Because most UTIs are caused by a bacterial infection, they usually can be treated with the use of antibiotics. The choice of antibiotic and length of treatment depend on your symptoms and the type of bacteria causing your infection. HOME CARE INSTRUCTIONS  If you  were prescribed antibiotics, take them exactly as your caregiver instructs you. Finish the medication even if you feel better after you have only taken some of the medication.  Drink enough water and fluids to keep your urine clear or pale yellow.  Avoid caffeine, tea, and carbonated beverages. They tend to irritate your bladder.  Empty your bladder often. Avoid holding urine for long periods of time.  Empty your bladder before and after sexual intercourse.  After a bowel movement, women should cleanse from front to back. Use each tissue only once. SEEK MEDICAL CARE IF:   You have back pain.  You develop a fever.  Your symptoms do not begin to resolve within 3 days. SEEK IMMEDIATE MEDICAL CARE IF:   You have severe back pain or lower abdominal pain.  You develop chills.  You have nausea or vomiting.  You have continued burning or discomfort with urination. MAKE SURE YOU:   Understand these instructions.  Will watch your condition.  Will get help right away if you are not doing well or get worse.   This information is not intended to replace advice given to you by your health care provider. Make sure you discuss any questions you have with your health care provider.   Document Released: 11/06/2004 Document Revised: 10/18/2014 Document Reviewed: 03/07/2011 Elsevier Interactive Patient Education [employer]2016 Elsevier Inc.

## 2015-06-16 ENCOUNTER — Encounter (HOSPITAL_COMMUNITY): Payer: Self-pay

## 2015-06-16 ENCOUNTER — Emergency Department (HOSPITAL_COMMUNITY)
Admission: EM | Admit: 2015-06-16 | Discharge: 2015-06-16 | Discharge status: Against Medical Advice | Payer: Medicaid Other | Attending: Emergency Medicine | Admitting: Emergency Medicine

## 2015-06-16 ENCOUNTER — Emergency Department (HOSPITAL_COMMUNITY): Payer: Medicaid Other

## 2015-06-16 DIAGNOSIS — R42 Dizziness and giddiness: Secondary | ICD-10-CM | POA: Diagnosis not present

## 2015-06-16 DIAGNOSIS — R079 Chest pain, unspecified: Secondary | ICD-10-CM

## 2015-06-16 DIAGNOSIS — F17210 Nicotine dependence, cigarettes, uncomplicated: Secondary | ICD-10-CM | POA: Insufficient documentation

## 2015-06-16 DIAGNOSIS — R0602 Shortness of breath: Secondary | ICD-10-CM | POA: Diagnosis not present

## 2015-06-16 DIAGNOSIS — Z7982 Long term (current) use of aspirin: Secondary | ICD-10-CM | POA: Insufficient documentation

## 2015-06-16 LAB — BASIC METABOLIC PANEL
ANION GAP: 7 (ref 5–15)
BUN: 17 mg/dL (ref 6–20)
CO2: 25 mmol/L (ref 22–32)
Calcium: 8.8 mg/dL — ABNORMAL LOW (ref 8.9–10.3)
Chloride: 106 mmol/L (ref 101–111)
Creatinine, Ser: 0.58 mg/dL (ref 0.44–1.00)
GFR calc Af Amer: >60 mL/min (ref >60)
GLUCOSE: 119 mg/dL — AB (ref 65–99)
POTASSIUM: 3.7 mmol/L (ref 3.5–5.1)
Sodium: 138 mmol/L (ref 135–145)

## 2015-06-16 LAB — CBC WITH DIFFERENTIAL/PLATELET
Basophils Absolute: 0 10*3/uL (ref 0.0–0.1)
Basophils Relative: 0 %
EOS PCT: 2 %
Eosinophils Absolute: 0.2 10*3/uL (ref 0.0–0.7)
HEMATOCRIT: 41.6 % (ref 36.0–46.0)
HEMOGLOBIN: 14 g/dL (ref 12.0–15.0)
LYMPHS ABS: 2.5 10*3/uL (ref 0.7–4.0)
LYMPHS PCT: 21 %
MCH: 31.6 pg (ref 26.0–34.0)
MCHC: 33.7 g/dL (ref 30.0–36.0)
MCV: 93.9 fL (ref 78.0–100.0)
MONO ABS: 1.1 10*3/uL — AB (ref 0.1–1.0)
MONOS PCT: 9 %
NEUTROS ABS: 8.2 10*3/uL — AB (ref 1.7–7.7)
Neutrophils Relative %: 68 %
Platelets: 267 10*3/uL (ref 150–400)
RBC: 4.43 MIL/uL (ref 3.87–5.11)
RDW: 12.9 % (ref 11.5–15.5)
WBC: 12 10*3/uL — ABNORMAL HIGH (ref 4.0–10.5)

## 2015-06-16 LAB — TROPONIN I

## 2015-06-16 NOTE — ED Notes (Signed)
Chest discomfort, started last night. Having pressure, short of breath, dizziness, and nausea.

## 2015-06-16 NOTE — ED Notes (Signed)
Pt advised of the risks of leaving AMA at this time. Pt states understanding of refusal of treatment and further examination at this time. Pt reports she will come back if symptoms worsen or "I need to". Pt ambulated to lobby with steady and even gait.

## 2015-06-16 NOTE — ED Notes (Signed)
Pt called out at this time stating she wishes to "sign out". Clarified with pt that she did not wish to stay to receive second blood test and result, pt stated she wanted to leave. MD Jacubowitz notified.

## 2015-06-16 NOTE — ED Notes (Signed)
Pt instructed to remain NPO until notified otherwise.

## 2015-06-16 NOTE — ED Notes (Signed)
MD at bedside. 

## 2015-06-16 NOTE — ED Notes (Signed)
MD in room advising pt of risks of leaving AMA.

## 2015-06-16 NOTE — ED Provider Notes (Signed)
CSN: [id number]     Arrival date & time 06/16/15  1932 History   First MD Initiated Contact with Patient 06/16/15 1934     Chief Complaint  Patient presents with  . Chest Pain     (Consider location/radiation/quality/duration/timing/severity/associated sxs/prior Treatment) HPI Complains of anterior chest pain described as pressure company by lightheadedness onset 10 PM yesterday symptoms accompanied by cough, shortness of breath, sinus congestion. Denies fever. Denies other associated symptoms discomfort is worse with expiration and with walking presently is mild. No other associated symptoms. Patient had similar chest pain in September 2016, was evaluated by Dr. Patty Sermons from cardiology determined to have no cardiac injury. No treatment prior to coming here. Presently discomfort is minimal Past Medical History  Diagnosis Date  . 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 Hypertension    History reviewed. No pertinent past surgical history. No family history on file. Family history mother had bypass surgery and valve surgery in her 64s  Social History  Substance Use Topics  . Smoking status: Current Every Day Smoker -- 0.50 packs/day    Types: Cigarettes  . Smokeless tobacco: None  . Alcohol Use: No   OB History    No data available     Review of Systems  Constitutional: Negative.   HENT: Negative.   Respiratory: Positive for cough and shortness of breath.   Cardiovascular: Positive for chest pain.  Gastrointestinal: Negative.   Genitourinary:       Last normal menstrual period 1.5 weeks ago  Musculoskeletal: Negative.   Skin: Negative.   Allergic/Immunologic: Positive for immunocompromised state.       Diabetic  Neurological: Positive for light-headedness.  Psychiatric/Behavioral: Negative.   All other systems reviewed and are negative.     Allergies  Tomato and Tylenol  Home Medications   Prior to Admission medications   Medication Sig Start Date End  Date Taking? Authorizing Provider  Artificial Tear Ointment (DRY EYES OP) Apply 2 drops to eye 3 (three) times daily.     Historical Provider, MD  aspirin-acetaminophen-caffeine (EXCEDRIN MIGRAINE) [phone] MG per tablet Take 2-3 tablets by mouth every 4 (four) hours as needed for migraine.     Historical Provider, MD  cephALEXin (KEFLEX) 500 MG capsule Take 1 capsule (500 mg total) by mouth 4 (four) times daily. 12/09/14   Mancel Bale, MD  clindamycin (CLEOCIN) 300 MG capsule Take 300 mg by mouth 3 (three) times daily.    Historical Provider, MD  HYDROcodone-acetaminophen (NORCO/VICODIN) 5-325 MG per tablet Take 1-2 tablets by mouth every 4 (four) hours as needed. Patient not taking: Reported on 12/09/2014 11/05/14   Donnetta Hutching, MD  oxymetazoline (AFRIN) 0.05 % nasal spray Place 2 sprays into both nostrils 2 (two) times daily.    Historical Provider, MD  promethazine (PHENERGAN) 25 MG tablet Take 1 tablet (25 mg total) by mouth every 6 (six) hours as needed. Patient not taking: Reported on 12/09/2014 11/05/14   Donnetta Hutching, MD   BP 159/91 mmHg  Pulse 84  Temp(Src) 98.1 F (36.7 C) (Oral)  Resp 18  Ht 5\' 5"  (1.651 m)  Wt 152 lb (68.947 kg)  BMI 25.29 kg/m2  SpO2 100%  LMP 05/20/2015 Physical Exam  Constitutional: She appears well-developed and well-nourished. No distress.  HENT:  Head: Normocephalic and atraumatic.  Eyes: Conjunctivae are normal. Pupils are equal, round, and reactive to light.  Neck: Neck supple. No tracheal deviation present. No thyromegaly present.  Cardiovascular: Normal rate and regular rhythm.  No murmur heard. Pulmonary/Chest: Effort normal and breath sounds normal.  Abdominal: Soft. Bowel sounds are normal. She exhibits no distension. There is no tenderness.  Musculoskeletal: Normal range of motion. She exhibits no edema or tenderness.  Neurological: She is alert. Coordination normal.  Skin: Skin is warm and dry. No rash noted.  Psychiatric: She has a  normal mood and affect.  Nursing note and vitals reviewed.   ED Course  Procedures (including critical care time) Labs Review Labs Reviewed - No data to display  Imaging Review No results found. I have personally reviewed and evaluated these images and lab results as part of my medical decision-making.   EKG Interpretation   Date/Time:  Saturday Jun 16 2015 19:46:06 EDT Ventricular Rate:  89 PR Interval:  124 QRS Duration: 106 QT Interval:  392 QTC Calculation: 477 R Axis:   71 Text Interpretation:  Sinus rhythm Baseline wander in lead(s) III aVL aVF  V1 V6 No significant change since last tracing Confirmed by Ethelda ChickJACUBOWITZ   MD, Dean Goldner [id number]([id number]) on 06/16/2015 7:53:55 PM     9:45 PM patient decided that she wanted to leave AMA . Results for orders placed or performed during the hospital encounter of 06/16/15  Troponin I  Result Value Ref Range   Troponin I <0.03 <0.031 ng/mL  Basic metabolic panel  Result Value Ref Range   Sodium 138 135 - 145 mmol/L   Potassium 3.7 3.5 - 5.1 mmol/L   Chloride 106 101 - 111 mmol/L   CO2 25 22 - 32 mmol/L   Glucose, Bld 119 (H) 65 - 99 mg/dL   BUN 17 6 - 20 mg/dL   Creatinine, Ser 1.300.58 0.44 - 1.00 mg/dL   Calcium 8.8 (L) 8.9 - 10.3 mg/dL   GFR calc non Af Amer >60 >60 mL/min   GFR calc Af Amer >60 >60 mL/min   Anion gap 7 5 - 15  CBC with Differential/Platelet  Result Value Ref Range   WBC 12.0 (H) 4.0 - 10.5 K/uL   RBC 4.43 3.87 - 5.11 MIL/uL   Hemoglobin 14.0 12.0 - 15.0 g/dL   HCT 86.541.6 78.436.0 - 69.646.0 %   MCV 93.9 78.0 - 100.0 fL   MCH 31.6 26.0 - 34.0 pg   MCHC 33.7 30.0 - 36.0 g/dL   RDW 29.512.9 28.411.5 - 13.215.5 %   Platelets 267 150 - 400 K/uL   Neutrophils Relative % 68 %   Neutro Abs 8.2 (H) 1.7 - 7.7 K/uL   Lymphocytes Relative 21 %   Lymphs Abs 2.5 0.7 - 4.0 K/uL   Monocytes Relative 9 %   Monocytes Absolute 1.1 (H) 0.1 - 1.0 K/uL   Eosinophils Relative 2 %   Eosinophils Absolute 0.2 0.0 - 0.7 K/uL   Basophils Relative 0 %    Basophils Absolute 0.0 0.0 - 0.1 K/uL   Dg Chest 2 View  06/16/2015  CLINICAL DATA:  35 year old female with a history of chest pain and cough EXAM: CHEST - 2 VIEW COMPARISON:  10/22/2014 FINDINGS: Cardiomediastinal silhouette projects within normal limits in size and contour. No confluent airspace disease, pneumothorax, or pleural effusion. No displaced fracture. Unremarkable appearance of the upper abdomen. IMPRESSION: No radiographic evidence of acute cardiopulmonary disease. Signed, Yvone NeuJaime S. Loreta AveWagner, DO [facility] Electronically Signed   By: Gilmer MorJaime  Wagner D.O.   On: 06/16/2015 20:59    MDM  Cardiac risk factors family history. Mother had bypass surgery in her 5540s, hypertension diabetes  smoker  Final diagnoses:  None  I explained to patient risk of heart attack and death although I feel that her risk of acute coronary syndrome is low. Heart score equals 3. She does not wish to wait for further lab evaluation. She is advised to go to another emergency department or to see her primary care physician for follow-up or return if she wishes to complete her evaluation I counseled patient for 5 minutes on smoking cessation Diagnosis #1 chest pain #2 tobacco abuse     Doug Sou, MD 06/16/15 2203

## 2015-06-16 NOTE — Discharge Instructions (Signed)
You are risking possibility of heart attack or death by leaving without completing her evaluation in the emergency department tonight. Return to go to another emergency department if you wish a complete evaluation. Follow up with your doctor as soon as possible. You may need other testing as an outpatient. Ask your doctor to help you to stop smoking.

## 2016-07-17 ENCOUNTER — Emergency Department (HOSPITAL_COMMUNITY)
Admission: EM | Admit: 2016-07-17 | Discharge: 2016-07-17 | Disposition: A | Discharge status: Home / Self Care | Payer: Medicaid Other | Attending: Emergency Medicine | Admitting: Emergency Medicine

## 2016-07-17 ENCOUNTER — Emergency Department (HOSPITAL_COMMUNITY): Payer: Medicaid Other

## 2016-07-17 ENCOUNTER — Encounter (HOSPITAL_COMMUNITY): Payer: Self-pay | Admitting: Emergency Medicine

## 2016-07-17 DIAGNOSIS — Y999 Unspecified external cause status: Secondary | ICD-10-CM | POA: Insufficient documentation

## 2016-07-17 DIAGNOSIS — Z5321 Procedure and treatment not carried out due to patient leaving prior to being seen by health care provider: Secondary | ICD-10-CM | POA: Insufficient documentation

## 2016-07-17 DIAGNOSIS — S6991XA Unspecified injury of right wrist, hand and finger(s), initial encounter: Secondary | ICD-10-CM | POA: Diagnosis present

## 2016-07-17 DIAGNOSIS — Y9389 Activity, other specified: Secondary | ICD-10-CM | POA: Diagnosis not present

## 2016-07-17 DIAGNOSIS — F17210 Nicotine dependence, cigarettes, uncomplicated: Secondary | ICD-10-CM | POA: Insufficient documentation

## 2016-07-17 DIAGNOSIS — W19XXXA Unspecified fall, initial encounter: Secondary | ICD-10-CM | POA: Insufficient documentation

## 2016-07-17 DIAGNOSIS — M25531 Pain in right wrist: Secondary | ICD-10-CM | POA: Insufficient documentation

## 2016-07-17 DIAGNOSIS — Y929 Unspecified place or not applicable: Secondary | ICD-10-CM | POA: Insufficient documentation

## 2016-07-17 NOTE — ED Notes (Signed)
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

## 2016-07-17 NOTE — ED Triage Notes (Signed)
Pt c/o right wrist pain after catching herself when falling today.

## 2019-01-21 ENCOUNTER — Other Ambulatory Visit: Payer: Self-pay

## 2019-01-21 DIAGNOSIS — Z20822 Contact with and (suspected) exposure to covid-19: Secondary | ICD-10-CM

## 2019-01-24 LAB — NOVEL CORONAVIRUS, NAA: SARS-CoV-2, NAA: NOT DETECTED

## 2019-05-30 DIAGNOSIS — E109 Type 1 diabetes mellitus without complications: Secondary | ICD-10-CM | POA: Insufficient documentation

## 2019-06-08 ENCOUNTER — Encounter (HOSPITAL_COMMUNITY): Payer: Self-pay | Admitting: Emergency Medicine

## 2019-06-08 ENCOUNTER — Emergency Department (HOSPITAL_COMMUNITY)
Admission: EM | Admit: 2019-06-08 | Discharge: 2019-06-08 | Disposition: A | Discharge status: Home / Self Care | Payer: Medicaid Other | Attending: Emergency Medicine | Admitting: Emergency Medicine

## 2019-06-08 ENCOUNTER — Other Ambulatory Visit: Payer: Self-pay

## 2019-06-08 DIAGNOSIS — Z79899 Other long term (current) drug therapy: Secondary | ICD-10-CM | POA: Diagnosis not present

## 2019-06-08 DIAGNOSIS — N390 Urinary tract infection, site not specified: Secondary | ICD-10-CM | POA: Insufficient documentation

## 2019-06-08 DIAGNOSIS — F17210 Nicotine dependence, cigarettes, uncomplicated: Secondary | ICD-10-CM | POA: Insufficient documentation

## 2019-06-08 DIAGNOSIS — R739 Hyperglycemia, unspecified: Secondary | ICD-10-CM | POA: Insufficient documentation

## 2019-06-08 DIAGNOSIS — R109 Unspecified abdominal pain: Secondary | ICD-10-CM | POA: Diagnosis present

## 2019-06-08 LAB — CBC
HCT: 49.1 % — ABNORMAL HIGH (ref 36.0–46.0)
Hemoglobin: 15.8 g/dL — ABNORMAL HIGH (ref 12.0–15.0)
MCH: 29.8 pg (ref 26.0–34.0)
MCHC: 32.2 g/dL (ref 30.0–36.0)
MCV: 92.6 fL (ref 80.0–100.0)
Platelets: 337 10*3/uL (ref 150–400)
RBC: 5.3 MIL/uL — ABNORMAL HIGH (ref 3.87–5.11)
RDW: 13 % (ref 11.5–15.5)
WBC: 13.4 10*3/uL — ABNORMAL HIGH (ref 4.0–10.5)
nRBC: 0 % (ref 0.0–0.2)

## 2019-06-08 LAB — URINALYSIS, ROUTINE W REFLEX MICROSCOPIC
Bilirubin Urine: NEGATIVE
Glucose, UA: >=500 mg/dL — AB
Ketones, ur: NEGATIVE mg/dL
Nitrite: NEGATIVE
Protein, ur: 100 mg/dL — AB
Specific Gravity, Urine: 1.03 (ref 1.005–1.030)
WBC, UA: >50 WBC/hpf — ABNORMAL HIGH (ref 0–5)
pH: 6 (ref 5.0–8.0)

## 2019-06-08 LAB — COMPREHENSIVE METABOLIC PANEL
ALT: 15 U/L (ref 0–44)
AST: 13 U/L — ABNORMAL LOW (ref 15–41)
Albumin: 3.6 g/dL (ref 3.5–5.0)
Alkaline Phosphatase: 72 U/L (ref 38–126)
Anion gap: 11 (ref 5–15)
BUN: 12 mg/dL (ref 6–20)
CO2: 24 mmol/L (ref 22–32)
Calcium: 8.8 mg/dL — ABNORMAL LOW (ref 8.9–10.3)
Chloride: 99 mmol/L (ref 98–111)
Creatinine, Ser: 0.51 mg/dL (ref 0.44–1.00)
GFR calc Af Amer: >60 mL/min (ref >60)
GFR calc non Af Amer: >60 mL/min (ref >60)
Glucose, Bld: 410 mg/dL — ABNORMAL HIGH (ref 70–99)
Potassium: 3.8 mmol/L (ref 3.5–5.1)
Sodium: 134 mmol/L — ABNORMAL LOW (ref 135–145)
Total Bilirubin: 0.2 mg/dL — ABNORMAL LOW (ref 0.3–1.2)
Total Protein: 7.3 g/dL (ref 6.5–8.1)

## 2019-06-08 LAB — LIPASE, BLOOD: Lipase: 14 U/L (ref 11–51)

## 2019-06-08 LAB — POC URINE PREG, ED: Preg Test, Ur: NEGATIVE

## 2019-06-08 LAB — CBG MONITORING, ED: Glucose-Capillary: 116 mg/dL — ABNORMAL HIGH (ref 70–99)

## 2019-06-08 MED ORDER — DEXTROSE-NACL 5-0.45 % IV SOLN: INTRAVENOUS | Status: DC

## 2019-06-08 MED ORDER — INSULIN REGULAR(HUMAN) IN NACL 100-0.9 UT/100ML-% IV SOLN: INTRAVENOUS | Status: DC

## 2019-06-08 MED ORDER — SODIUM CHLORIDE 0.9 % IV BOLUS
1000.0000 mL | Freq: Once | INTRAVENOUS | Status: AC
  Administered 2019-06-08: 03:00:00 1000 mL via INTRAVENOUS

## 2019-06-08 MED ORDER — INSULIN ASPART 100 UNIT/ML ~~LOC~~ SOLN
8.0000 [IU] | Freq: Once | SUBCUTANEOUS | Status: AC
  Administered 2019-06-08: 8 [IU] via INTRAVENOUS
  Filled 2019-06-08: qty 1

## 2019-06-08 MED ORDER — CEPHALEXIN 500 MG PO CAPS
500.0000 mg | ORAL_CAPSULE | Freq: Once | ORAL | Status: AC
  Administered 2019-06-08: 500 mg via ORAL
  Filled 2019-06-08: qty 1

## 2019-06-08 MED ORDER — SODIUM CHLORIDE 0.9 % IV BOLUS
1000.0000 mL | Freq: Once | INTRAVENOUS | Status: AC
  Administered 2019-06-08: 1000 mL via INTRAVENOUS

## 2019-06-08 MED ORDER — SODIUM CHLORIDE 0.9% FLUSH: 3.0000 mL | Freq: Once | INTRAVENOUS | Status: DC

## 2019-06-08 MED ORDER — DEXTROSE 50 % IV SOLN: 0.0000 mL | INTRAVENOUS | Status: DC | PRN

## 2019-06-08 MED ORDER — SODIUM CHLORIDE 0.9 % IV SOLN: INTRAVENOUS | Status: DC

## 2019-06-08 MED ORDER — CEPHALEXIN 500 MG PO CAPS
500.0000 mg | ORAL_CAPSULE | Freq: Three times a day (TID) | ORAL | 0 refills | Status: DC
Start: 2019-06-08 — End: 2020-01-31

## 2019-06-08 MED ORDER — METFORMIN HCL 500 MG PO TABS
500.0000 mg | ORAL_TABLET | Freq: Two times a day (BID) | ORAL | 0 refills | Status: DC
Start: 2019-06-08 — End: 2020-02-02

## 2019-06-08 NOTE — ED Provider Notes (Signed)
[facility] EMERGENCY DEPARTMENT Provider Note   CSN: [id number] Arrival date & time: 06/08/19  0123   Time seen 2:50 AM  History Chief Complaint  Patient presents with  . Abdominal Pain    Heather Richard is a 39 y.o. female.  HPI   Patient states of the past year she has been getting vaginal yeast infections but because of Covid she did not get checked out.  She states she also has had a 40 pound weight loss of health trying to lose weight.  She complains of being thirsty for about a year and always having a dry mouth.  She states tonight she started having pressure in her suprapubic area and started having urinary frequency to the point she states about midnight she was going to urinate back to back.  She denies dysuria.  She states she had gestational diabetes with her pregnancies and the last time was 9 years ago.  She states at that time she did not require medications but just watched her diet.  She states she has a child with type 1 diabetes and they follow a normal healthy diet.  [facility]   History reviewed. No pertinent past medical history.  There are no problems to display for this patient.   History reviewed. No pertinent surgical history.   OB History   No obstetric history on file.     No family history on file.  Social History   Tobacco Use  . Smoking status: Current Every Day Smoker    Packs/day: 0.50    Types: Cigarettes  . Smokeless tobacco: Never Used  Substance Use Topics  . Alcohol use: No  . Drug use: No    Home Medications Prior to Admission medications   Medication Sig Start Date End Date Taking? Authorizing Provider  aspirin-acetaminophen-caffeine (EXCEDRIN MIGRAINE) [phone] MG per tablet Take 2-3 tablets by mouth every 4 (four) hours as needed for migraine.     [provider]  Buprenorphine HCl-Naloxone HCl (SUBOXONE) 8-2 MG FILM Place 0.5 Film under the tongue daily.    [provider]    cephALEXin (KEFLEX) 500 MG capsule Take 1 capsule (500 mg total) by mouth 3 (three) times daily. 06/08/19   Rolland Porter, MD  metFORMIN (GLUCOPHAGE) 500 MG tablet Take 1 tablet (500 mg total) by mouth 2 (two) times daily with a meal. 06/08/19   Rolland Porter, MD    Allergies    Tomato and Tylenol [acetaminophen]  Review of Systems   Review of Systems  All other systems reviewed and are negative.   Physical Exam Updated Vital Signs BP (!) 156/90 (BP Location: Right Arm)   Pulse 97   Temp 98 F (36.7 C) (Oral)   Resp 15   Ht 5\' 6"  (1.676 m)   Wt 72.6 kg   LMP 04/24/2019   SpO2 96%   BMI 25.82 kg/m   Physical Exam Vitals and nursing note reviewed.  Constitutional:      General: She is not in acute distress.    Appearance: Normal appearance. She is well-developed. She is not ill-appearing or toxic-appearing.  HENT:     Head: Normocephalic and atraumatic.     Right Ear: External ear normal.     Left Ear: External ear normal.     Nose: Nose normal. No mucosal edema or rhinorrhea.     Mouth/Throat:     Mouth: Mucous membranes are dry.     Dentition: No dental abscesses.  Pharynx: No uvula swelling.  Eyes:     Extraocular Movements: Extraocular movements intact.     Conjunctiva/sclera: Conjunctivae normal.     Pupils: Pupils are equal, round, and reactive to light.  Cardiovascular:     Rate and Rhythm: Normal rate and regular rhythm.     Heart sounds: Normal heart sounds. No murmur. No friction rub. No gallop.   Pulmonary:     Effort: Pulmonary effort is normal. No respiratory distress.     Breath sounds: Normal breath sounds. No wheezing, rhonchi or rales.  Chest:     Chest wall: No tenderness or crepitus.  Abdominal:     General: Bowel sounds are normal. There is no distension.     Palpations: Abdomen is soft.     Tenderness: There is no abdominal tenderness. There is no guarding or rebound.  Musculoskeletal:        General: No tenderness. Normal range of motion.      Cervical back: Full passive range of motion without pain, normal range of motion and neck supple.     Comments: Moves all extremities well.   Skin:    General: Skin is warm and dry.     Coloration: Skin is not pale.     Findings: No erythema or rash.  Neurological:     General: No focal deficit present.     Mental Status: She is alert and oriented to person, place, and time.     Cranial Nerves: No cranial nerve deficit.  Psychiatric:        Mood and Affect: Mood normal. Mood is not anxious.        Speech: Speech normal.        Behavior: Behavior normal.        Thought Content: Thought content normal.     ED Results / Procedures / Treatments   Labs (all labs ordered are listed, but only abnormal results are displayed) Results for orders placed or performed during the hospital encounter of 06/08/19  Lipase, blood  Result Value Ref Range   Lipase 14 11 - 51 U/L  Comprehensive metabolic panel  Result Value Ref Range   Sodium 134 (L) 135 - 145 mmol/L   Potassium 3.8 3.5 - 5.1 mmol/L   Chloride 99 98 - 111 mmol/L   CO2 24 22 - 32 mmol/L   Glucose, Bld 410 (H) 70 - 99 mg/dL   BUN 12 6 - 20 mg/dL   Creatinine, Ser 3.53 0.44 - 1.00 mg/dL   Calcium 8.8 (L) 8.9 - 10.3 mg/dL   Total Protein 7.3 6.5 - 8.1 g/dL   Albumin 3.6 3.5 - 5.0 g/dL   AST 13 (L) 15 - 41 U/L   ALT 15 0 - 44 U/L   Alkaline Phosphatase 72 38 - 126 U/L   Total Bilirubin 0.2 (L) 0.3 - 1.2 mg/dL   GFR calc non Af Amer >60 >60 mL/min   GFR calc Af Amer >60 >60 mL/min   Anion gap 11 5 - 15  CBC  Result Value Ref Range   WBC 13.4 (H) 4.0 - 10.5 K/uL   RBC 5.30 (H) 3.87 - 5.11 MIL/uL   Hemoglobin 15.8 (H) 12.0 - 15.0 g/dL   HCT 29.9 (H) 24.2 - 68.3 %   MCV 92.6 80.0 - 100.0 fL   MCH 29.8 26.0 - 34.0 pg   MCHC 32.2 30.0 - 36.0 g/dL   RDW 41.9 62.2 - 29.7 %   Platelets 337 150 - 400  K/uL   nRBC 0.0 0.0 - 0.2 %  Urinalysis, Routine w reflex microscopic  Result Value Ref Range   Color, Urine STRAW (A) YELLOW    APPearance HAZY (A) CLEAR   Specific Gravity, Urine 1.030 1.005 - 1.030   pH 6.0 5.0 - 8.0   Glucose, UA >=500 (A) NEGATIVE mg/dL   Hgb urine dipstick MODERATE (A) NEGATIVE   Bilirubin Urine NEGATIVE NEGATIVE   Ketones, ur NEGATIVE NEGATIVE mg/dL   Protein, ur 350 (A) NEGATIVE mg/dL   Nitrite NEGATIVE NEGATIVE   Leukocytes,Ua LARGE (A) NEGATIVE   RBC / HPF 21-50 0 - 5 RBC/hpf   WBC, UA >50 (H) 0 - 5 WBC/hpf   Bacteria, UA RARE (A) NONE SEEN   Squamous Epithelial / LPF 0-5 0 - 5  POC urine preg, ED  Result Value Ref Range   Preg Test, Ur NEGATIVE NEGATIVE  CBG monitoring, ED  Result Value Ref Range   Glucose-Capillary 116 (H) 70 - 99 mg/dL   Laboratory interpretation all normal except hyperglycemia without metabolic acidosis, possible UTI    EKG None  Radiology No results found.  Procedures Procedures (including critical care time)  Medications Ordered in ED Medications  sodium chloride flush (NS) 0.9 % injection 3 mL (3 mLs Intravenous Not Given 06/08/19 0323)  insulin regular, human (MYXREDLIN) 100 units/ 100 mL infusion ( Intravenous Refused 06/08/19 0333)  0.9 %  sodium chloride infusion ( Intravenous New Bag/Given (Non-Interop) 06/08/19 0323)  dextrose 5 %-0.45 % sodium chloride infusion ( Intravenous Refused 06/08/19 0418)  dextrose 50 % solution 0-50 mL (has no administration in time range)  sodium chloride 0.9 % bolus 1,000 mL (0 mLs Intravenous Stopped 06/08/19 0418)  sodium chloride 0.9 % bolus 1,000 mL (0 mLs Intravenous Stopped 06/08/19 0418)  cephALEXin (KEFLEX) capsule 500 mg (500 mg Oral Given 06/08/19 0322)  insulin aspart (novoLOG) injection 8 Units (8 Units Intravenous Given 06/08/19 0324)    ED Course  I have reviewed the triage vital signs and the nursing notes.  Pertinent labs & imaging results that were available during my care of the patient were reviewed by me and considered in my medical decision making (see chart for details).    MDM  Rules/Calculators/A&P                      Patient was given IV fluids.  She stated she could not stay in the ED could do the Glucomander.  She was given IV insulin.  When her CBG was rechecked and an hour it had improved to 116.  She possibly had a urinary tract infection and she was started on oral Keflex.   Final Clinical Impression(s) / ED Diagnoses Final diagnoses:  Hyperglycemia  Urinary tract infection with hematuria, site unspecified    Rx / DC Orders ED Discharge Orders         Ordered    cephALEXin (KEFLEX) 500 MG capsule  3 times daily     06/08/19 0446    metFORMIN (GLUCOPHAGE) 500 MG tablet  2 times daily with meals     06/08/19 0446          Plan discharge  Devoria Albe, MD, Concha Pyo, MD 06/08/19 [phone]

## 2019-06-08 NOTE — ED Notes (Signed)
Pt states she has young kids at home and cannot stay long to receive an insulin gtt. But will agree to boluses and iv insulin

## 2019-06-08 NOTE — ED Triage Notes (Signed)
Pt C/O lower abdominal pain and polyuria that started at midnight tonight. Denies dysuria or foul odor with urination.

## 2019-06-08 NOTE — Discharge Instructions (Addendum)
You should monitor your blood sugar closely.  It sounds like you are already following a diabetic diet but just review it to make sure.  Start the Metformin twice a day.  Please follow-up with the health department to make sure you are getting good control.  You may have a mild urinary tract infection, take the antibiotic until gone.

## 2019-10-20 DIAGNOSIS — H543 Unqualified visual loss, both eyes: Secondary | ICD-10-CM | POA: Insufficient documentation

## 2019-10-20 DIAGNOSIS — F1199 Opioid use, unspecified with unspecified opioid-induced disorder: Secondary | ICD-10-CM | POA: Insufficient documentation

## 2019-10-20 DIAGNOSIS — G43909 Migraine, unspecified, not intractable, without status migrainosus: Secondary | ICD-10-CM | POA: Insufficient documentation

## 2019-10-20 DIAGNOSIS — F329 Major depressive disorder, single episode, unspecified: Secondary | ICD-10-CM | POA: Insufficient documentation

## 2019-10-20 DIAGNOSIS — E1065 Type 1 diabetes mellitus with hyperglycemia: Secondary | ICD-10-CM | POA: Insufficient documentation

## 2020-01-19 ENCOUNTER — Emergency Department (HOSPITAL_COMMUNITY)
Admission: EM | Admit: 2020-01-19 | Discharge: 2020-01-19 | Disposition: A | Discharge status: Home / Self Care | Payer: Medicaid Other | Attending: Emergency Medicine | Admitting: Emergency Medicine

## 2020-01-19 ENCOUNTER — Emergency Department (HOSPITAL_COMMUNITY): Payer: Medicaid Other

## 2020-01-19 DIAGNOSIS — T1490XA Injury, unspecified, initial encounter: Secondary | ICD-10-CM

## 2020-01-19 DIAGNOSIS — S52502A Unspecified fracture of the lower end of left radius, initial encounter for closed fracture: Secondary | ICD-10-CM

## 2020-01-19 DIAGNOSIS — Z23 Encounter for immunization: Secondary | ICD-10-CM | POA: Insufficient documentation

## 2020-01-19 DIAGNOSIS — S6992XA Unspecified injury of left wrist, hand and finger(s), initial encounter: Secondary | ICD-10-CM | POA: Diagnosis present

## 2020-01-19 DIAGNOSIS — S3991XA Unspecified injury of abdomen, initial encounter: Secondary | ICD-10-CM | POA: Insufficient documentation

## 2020-01-19 DIAGNOSIS — Y92410 Unspecified street and highway as the place of occurrence of the external cause: Secondary | ICD-10-CM | POA: Diagnosis not present

## 2020-01-19 LAB — COMPREHENSIVE METABOLIC PANEL
ALT: 25 U/L (ref 0–44)
AST: 36 U/L (ref 15–41)
Albumin: 3.5 g/dL (ref 3.5–5.0)
Alkaline Phosphatase: 64 U/L (ref 38–126)
Anion gap: 12 (ref 5–15)
BUN: 16 mg/dL (ref 6–20)
CO2: 23 mmol/L (ref 22–32)
Calcium: 9.2 mg/dL (ref 8.9–10.3)
Chloride: 100 mmol/L (ref 98–111)
Creatinine, Ser: 0.66 mg/dL (ref 0.44–1.00)
GFR, Estimated: >60 mL/min (ref >60)
Glucose, Bld: 337 mg/dL — ABNORMAL HIGH (ref 70–99)
Potassium: 4 mmol/L (ref 3.5–5.1)
Sodium: 135 mmol/L (ref 135–145)
Total Bilirubin: 0.4 mg/dL (ref 0.3–1.2)
Total Protein: 6.7 g/dL (ref 6.5–8.1)

## 2020-01-19 LAB — I-STAT BETA HCG BLOOD, ED (MC, WL, AP ONLY): I-stat hCG, quantitative: <5 m[IU]/mL (ref <5)

## 2020-01-19 LAB — CBC
HCT: 46.4 % — ABNORMAL HIGH (ref 36.0–46.0)
Hemoglobin: 15.1 g/dL — ABNORMAL HIGH (ref 12.0–15.0)
MCH: 29.3 pg (ref 26.0–34.0)
MCHC: 32.5 g/dL (ref 30.0–36.0)
MCV: 90.1 fL (ref 80.0–100.0)
Platelets: 329 10*3/uL (ref 150–400)
RBC: 5.15 MIL/uL — ABNORMAL HIGH (ref 3.87–5.11)
RDW: 13.4 % (ref 11.5–15.5)
WBC: 16.3 10*3/uL — ABNORMAL HIGH (ref 4.0–10.5)
nRBC: 0 % (ref 0.0–0.2)

## 2020-01-19 LAB — I-STAT CHEM 8, ED
BUN: 17 mg/dL (ref 6–20)
Calcium, Ion: 1.02 mmol/L — ABNORMAL LOW (ref 1.15–1.40)
Chloride: 102 mmol/L (ref 98–111)
Creatinine, Ser: 0.5 mg/dL (ref 0.44–1.00)
Glucose, Bld: 321 mg/dL — ABNORMAL HIGH (ref 70–99)
HCT: 48 % — ABNORMAL HIGH (ref 36.0–46.0)
Hemoglobin: 16.3 g/dL — ABNORMAL HIGH (ref 12.0–15.0)
Potassium: 3.9 mmol/L (ref 3.5–5.1)
Sodium: 134 mmol/L — ABNORMAL LOW (ref 135–145)
TCO2: 22 mmol/L (ref 22–32)

## 2020-01-19 LAB — PROTIME-INR
INR: 0.9 (ref 0.8–1.2)
Prothrombin Time: 11.3 seconds — ABNORMAL LOW (ref 11.4–15.2)

## 2020-01-19 LAB — URINALYSIS, ROUTINE W REFLEX MICROSCOPIC
Bacteria, UA: NONE SEEN
Bilirubin Urine: NEGATIVE
Glucose, UA: >=500 mg/dL — AB
Hgb urine dipstick: NEGATIVE
Ketones, ur: NEGATIVE mg/dL
Leukocytes,Ua: NEGATIVE
Nitrite: NEGATIVE
Protein, ur: 100 mg/dL — AB
Specific Gravity, Urine: 1.04 — ABNORMAL HIGH (ref 1.005–1.030)
pH: 5 (ref 5.0–8.0)

## 2020-01-19 LAB — SAMPLE TO BLOOD BANK

## 2020-01-19 LAB — ETHANOL: Alcohol, Ethyl (B): <10 mg/dL (ref <10)

## 2020-01-19 LAB — LACTIC ACID, PLASMA: Lactic Acid, Venous: 2 mmol/L (ref 0.5–1.9)

## 2020-01-19 MED ORDER — OXYCODONE HCL 5 MG PO TABS
5.0000 mg | ORAL_TABLET | Freq: Once | ORAL | Status: AC
Start: 2020-01-19 — End: 2020-01-19
  Administered 2020-01-19: 5 mg via ORAL
  Filled 2020-01-19: qty 1

## 2020-01-19 MED ORDER — TETANUS-DIPHTH-ACELL PERTUSSIS 5-2.5-18.5 LF-MCG/0.5 IM SUSY
0.5000 mL | PREFILLED_SYRINGE | Freq: Once | INTRAMUSCULAR | Status: AC
  Administered 2020-01-19: 0.5 mL via INTRAMUSCULAR
  Filled 2020-01-19: qty 0.5

## 2020-01-19 MED ORDER — FENTANYL CITRATE (PF) 100 MCG/2ML IJ SOLN
INTRAMUSCULAR | Status: AC
  Administered 2020-01-19: 50 ug via INTRAVENOUS
  Filled 2020-01-19: qty 2

## 2020-01-19 MED ORDER — MORPHINE SULFATE 30 MG PO TABS: 15.0000 mg | ORAL_TABLET | Freq: Four times a day (QID) | ORAL | 0 refills | Status: DC | PRN

## 2020-01-19 MED ORDER — LACTATED RINGERS IV BOLUS
1000.0000 mL | Freq: Once | INTRAVENOUS | Status: AC
  Administered 2020-01-19: 1000 mL via INTRAVENOUS

## 2020-01-19 MED ORDER — MORPHINE SULFATE 15 MG PO TABS
15.0000 mg | ORAL_TABLET | ORAL | Status: DC
Start: 2020-01-19 — End: 2020-01-19
  Filled 2020-01-19: qty 1

## 2020-01-19 MED ORDER — IOHEXOL 300 MG/ML  SOLN
100.0000 mL | Freq: Once | INTRAMUSCULAR | Status: AC | PRN
  Administered 2020-01-19: 100 mL via INTRAVENOUS
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
## 2020-01-19 NOTE — ED Notes (Signed)
Patient ambulated to restroom with NT assistance, gait steady. NAD noted.

## 2020-01-19 NOTE — ED Notes (Signed)
Xray still in room with patient. CT ready when xrays complete.

## 2020-01-19 NOTE — ED Notes (Signed)
ED Provider at bedside. 

## 2020-01-19 NOTE — ED Notes (Addendum)
CT scanner 1 ready for patient, primary RN  notified.

## 2020-01-19 NOTE — ED Notes (Signed)
Ortho tech completed putting on Lt wrist splint and Lt arm sling

## 2020-01-19 NOTE — Progress Notes (Signed)
Orthopedic Tech Progress Note Patient Details:  Glory [place] 01-05-1981 [id number]  Ortho Devices Type of Ortho Device: Sugartong splint,Arm sling Ortho Device/Splint Location: lue. I applied lue sugartong at drs request. Ortho Device/Splint Interventions: Ordered,Application,Adjustment   Post Interventions Patient Tolerated: Well Instructions Provided: Care of device,Adjustment of device   Trinna Post 01/19/2020, 10:35 PM

## 2020-01-19 NOTE — ED Notes (Signed)
Pt in CT.
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
## 2020-01-19 NOTE — ED Notes (Signed)
Xray in room at this time.

## 2020-01-19 NOTE — ED Notes (Signed)
Ortho tech paged for Lt wrist splint

## 2020-01-19 NOTE — ED Notes (Signed)
Date and time results received: 01/19/20 9:40 PM  Test: Lactic Acid Critical Value: 2.0  Name of Provider Notified: Dr. Curlene Labrum via Epic message

## 2020-01-19 NOTE — ED Notes (Signed)
Husband at bedside. Patient GCS remains 15.

## 2020-01-20 ENCOUNTER — Telehealth: Payer: Self-pay | Admitting: *Deleted

## 2020-01-20 DIAGNOSIS — S52502A Unspecified fracture of the lower end of left radius, initial encounter for closed fracture: Secondary | ICD-10-CM

## 2020-01-20 MED ORDER — MORPHINE SULFATE 30 MG PO TABS: 15.0000 mg | ORAL_TABLET | Freq: Four times a day (QID) | ORAL | 0 refills | Status: DC | PRN

## 2020-01-20 NOTE — Telephone Encounter (Signed)
RNCM called pt to inform her that EDP rerouted Rx to alternate pharmacy.

## 2020-01-20 NOTE — Telephone Encounter (Signed)
Pt called regarding pharmacy not have Rx in stock until mid next week and requesting Rx be sent to alternate pharmacy.  RNCM secure chatted prescribing EDP with pt request.

## 2020-01-20 NOTE — Telephone Encounter (Signed)
Notified that patient's preferred pharmacy did not have MSIR available. Order routed to alternate preferred pharmacy.  Gershon Mussel, MD 01/20/2020 2:54PM

## 2020-01-23 ENCOUNTER — Telehealth (HOSPITAL_COMMUNITY): Payer: Self-pay | Admitting: Emergency Medicine

## 2020-01-23 MED ORDER — MORPHINE SULFATE 30 MG PO TABS: 15.0000 mg | ORAL_TABLET | ORAL | 0 refills | Status: DC | PRN
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
## 2020-01-25 ENCOUNTER — Telehealth: Payer: Self-pay | Admitting: Orthopedic Surgery

## 2020-01-25 NOTE — Telephone Encounter (Signed)
Call/message from patient received 01/23/20; no call back - I called back to patient to follow up in regard to emergency room visit at [facility]. Reached voice mail; left message.

## 2020-01-25 NOTE — Telephone Encounter (Signed)
Patient called back to request appointment - as noted, Heather Richard emergency room visit was 01/19/20. States she has already "had to loosen the bandage as it had been too tight"  Please review and advise, as visit summary indicates orthopaedic hand surgeon.

## 2020-01-25 NOTE — Telephone Encounter (Signed)
Thanks  Have her come see me next week.  We can transition her to a cast.  I do not anticipate that she will need surgery.   Damonta Cossey A. Dallas Schimke, MD MS [facility] [address] [place],  [place]  [address] Phone: [phone] Fax: [phone]

## 2020-01-26 NOTE — Telephone Encounter (Signed)
As of this afternoon, 01/26/20, patient returned call; appointment scheduled; patient aware.

## 2020-01-26 NOTE — Telephone Encounter (Signed)
Called patient to offer appointment per Dr Dallas Schimke' response. Reached voicemail, left message.

## 2020-01-31 ENCOUNTER — Ambulatory Visit (INDEPENDENT_AMBULATORY_CARE_PROVIDER_SITE_OTHER): Payer: Medicaid Other | Admitting: Orthopedic Surgery

## 2020-01-31 ENCOUNTER — Ambulatory Visit: Payer: Medicaid Other

## 2020-01-31 ENCOUNTER — Encounter: Payer: Self-pay | Admitting: Orthopedic Surgery

## 2020-01-31 ENCOUNTER — Other Ambulatory Visit: Payer: Self-pay

## 2020-01-31 VITALS — BP 166/113 | HR 101 | Ht 65.25 in | Wt 150.0 lb

## 2020-01-31 DIAGNOSIS — M25532 Pain in left wrist: Secondary | ICD-10-CM

## 2020-01-31 DIAGNOSIS — M25511 Pain in right shoulder: Secondary | ICD-10-CM

## 2020-01-31 DIAGNOSIS — S52502A Unspecified fracture of the lower end of left radius, initial encounter for closed fracture: Secondary | ICD-10-CM

## 2020-01-31 DIAGNOSIS — S62321A Displaced fracture of shaft of second metacarpal bone, left hand, initial encounter for closed fracture: Secondary | ICD-10-CM
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
## 2020-02-01 NOTE — Patient Instructions (Addendum)
Your procedure is scheduled on: 02/09/2020  Report to [facility] Main Entrance at   8:10  AM.  Call this number if you have problems the morning of surgery: [phone]   Remember:   Do not Eat or Drink after midnight         No Smoking the morning of surgery  :  Take these medicines the morning of surgery with A SIP OF WATER:subutex or morphine if needed, flexeril(if needed), imitrex(if needed). Take 13.5 units of your glargine the night before your procedure. DO NOT take any medications for diabetes the morning of your procedure.     DO NOT smoke the morning of your procedure.   Do not wear jewelry, make-up or nail polish.  Do not wear lotions, powders, or perfumes. You may wear deodorant.  Do not shave 48 hours prior to surgery. Men may shave face and neck.  Do not bring valuables to the hospital.  Contacts, dentures or bridgework may not be worn into surgery.  Leave suitcase in the car. After surgery it may be brought to your room.  For patients admitted to the hospital, checkout time is 11:00 AM the day of discharge.   Patients discharged the day of surgery will not be allowed to drive home.    Special Instructions: Shower using CHG night before surgery and shower the day of surgery use CHG.  Use special wash - you have one bottle of CHG for all showers.  You should use approximately 1/2 of the bottle for each shower.  How to Use Chlorhexidine for Bathing Chlorhexidine gluconate (CHG) is a germ-killing (antiseptic) solution that is used to clean the skin. It can get rid of the bacteria that normally live on the skin and can keep them away for about 24 hours. To clean your skin with CHG, you may be given:  A CHG solution to use in the shower or as part of a sponge bath.  A prepackaged cloth that contains CHG. Cleaning your skin with CHG may help lower the risk for infection:  While you are staying in the intensive care unit of the hospital.  If you have a vascular access,  such as a central line, to provide short-term or long-term access to your veins.  If you have a catheter to drain urine from your bladder.  If you are on a ventilator. A ventilator is a machine that helps you breathe by moving air in and out of your lungs.  After surgery. What are the risks? Risks of using CHG include:  A skin reaction.  Hearing loss, if CHG gets in your ears.  Eye injury, if CHG gets in your eyes and is not rinsed out.  The CHG product catching fire. Make sure that you avoid smoking and flames after applying CHG to your skin. Do not use CHG:  If you have a chlorhexidine allergy or have previously reacted to chlorhexidine.  On babies younger than 52 months of age. How to use CHG solution  Use CHG only as told by your health care provider, and follow the instructions on the label.  Use the full amount of CHG as directed. Usually, this is one bottle. During a shower Follow these steps when using CHG solution during a shower (unless your health care provider gives you different instructions): 1. Start the shower. 2. Use your normal soap and shampoo to wash your face and hair. 3. Turn off the shower or move out of the shower stream. 4. Pour  the CHG onto a clean washcloth. Do not use any type of brush or rough-edged sponge. 5. Starting at your neck, lather your body down to your toes. Make sure you follow these instructions: ? If you will be having surgery, pay special attention to the part of your body where you will be having surgery. Scrub this area for at least 1 minute. ? Do not use CHG on your head or face. If the solution gets into your ears or eyes, rinse them well with water. ? Avoid your genital area. ? Avoid any areas of skin that have broken skin, cuts, or scrapes. ? Scrub your back and under your arms. Make sure to wash skin folds. 6. Let the lather sit on your skin for 1-2 minutes or as long as told by your health care provider. 7. Thoroughly rinse  your entire body in the shower. Make sure that all body creases and crevices are rinsed well. 8. Dry off with a clean towel. Do not put any substances on your body afterward--such as powder, lotion, or perfume--unless you are told to do so by your health care provider. Only use lotions that are recommended by the manufacturer. 9. Put on clean clothes or pajamas. 10. If it is the night before your surgery, sleep in clean sheets.  During a sponge bath Follow these steps when using CHG solution during a sponge bath (unless your health care provider gives you different instructions): 1. Use your normal soap and shampoo to wash your face and hair. 2. Pour the CHG onto a clean washcloth. 3. Starting at your neck, lather your body down to your toes. Make sure you follow these instructions: ? If you will be having surgery, pay special attention to the part of your body where you will be having surgery. Scrub this area for at least 1 minute. ? Do not use CHG on your head or face. If the solution gets into your ears or eyes, rinse them well with water. ? Avoid your genital area. ? Avoid any areas of skin that have broken skin, cuts, or scrapes. ? Scrub your back and under your arms. Make sure to wash skin folds. 4. Let the lather sit on your skin for 1-2 minutes or as long as told by your health care provider. 5. Using a different clean, wet washcloth, thoroughly rinse your entire body. Make sure that all body creases and crevices are rinsed well. 6. Dry off with a clean towel. Do not put any substances on your body afterward--such as powder, lotion, or perfume--unless you are told to do so by your health care provider. Only use lotions that are recommended by the manufacturer. 7. Put on clean clothes or pajamas. 8. If it is the night before your surgery, sleep in clean sheets. How to use CHG prepackaged cloths  Only use CHG cloths as told by your health care provider, and follow the instructions on the  label.  Use the CHG cloth on clean, dry skin.  Do not use the CHG cloth on your head or face unless your health care provider tells you to.  When washing with the CHG cloth: ? Avoid your genital area. ? Avoid any areas of skin that have broken skin, cuts, or scrapes. Before surgery Follow these steps when using a CHG cloth to clean before surgery (unless your health care provider gives you different instructions): 1. Using the CHG cloth, vigorously scrub the part of your body where you will be having surgery. Scrub  using a back-and-forth motion for 3 minutes. The area on your body should be completely wet with CHG when you are done scrubbing. 2. Do not rinse. Discard the cloth and let the area air-dry. Do not put any substances on the area afterward, such as powder, lotion, or perfume. 3. Put on clean clothes or pajamas. 4. If it is the night before your surgery, sleep in clean sheets.  For general bathing Follow these steps when using CHG cloths for general bathing (unless your health care provider gives you different instructions). 1. Use a separate CHG cloth for each area of your body. Make sure you wash between any folds of skin and between your fingers and toes. Wash your body in the following order, switching to a new cloth after each step: ? The front of your neck, shoulders, and chest. ? Both of your arms, under your arms, and your hands. ? Your stomach and groin area, avoiding the genitals. ? Your right leg and foot. ? Your left leg and foot. ? The back of your neck, your back, and your buttocks. 2. Do not rinse. Discard the cloth and let the area air-dry. Do not put any substances on your body afterward--such as powder, lotion, or perfume--unless you are told to do so by your health care provider. Only use lotions that are recommended by the manufacturer. 3. Put on clean clothes or pajamas. Contact a health care provider if:  Your skin gets irritated after scrubbing.  You  have questions about using your solution or cloth. Get help right away if:  Your eyes become very red or swollen.  Your eyes itch badly.  Your skin itches badly and is red or swollen.  Your hearing changes.  You have trouble seeing.  You have swelling or tingling in your mouth or throat.  You have trouble breathing.  You swallow any chlorhexidine. Summary  Chlorhexidine gluconate (CHG) is a germ-killing (antiseptic) solution that is used to clean the skin. Cleaning your skin with CHG may help to lower your risk for infection.  You may be given CHG to use for bathing. It may be in a bottle or in a prepackaged cloth to use on your skin. Carefully follow your health care provider's instructions and the instructions on the product label.  Do not use CHG if you have a chlorhexidine allergy.  Contact your health care provider if your skin gets irritated after scrubbing. This information is not intended to replace advice given to you by your health care provider. Make sure you discuss any questions you have with your health care provider. Document Revised: 04/15/2018 Document Reviewed: 12/25/2016 Elsevier Patient Education  2020 [employer]  Wrist Fracture Treated With ORIF, Care After This sheet gives you information about how to care for yourself after your procedure. Your health care provider may also give you more specific instructions. If you have problems or questions, contact your health care provider. What can I expect after the procedure? After the procedure, it is common to have:  Pain.  Swelling.  Stiffness.  A small amount of drainage from the incision. Follow these instructions at home: If you have a cast:  Do not stick anything inside the cast to scratch your skin. Doing that increases your risk of infection.  Check the skin around the cast every day. Tell your health care provider about any concerns.  You may put lotion on dry skin around the edges of the  cast. Do not put lotion on the skin  underneath the cast.  Keep the cast clean and dry. If you have a splint or sling:  Wear the splint or sling as told by your health care provider. Remove it only as told by your health care provider.  Loosen the splint or sling if your fingers tingle, become numb, or turn cold and blue.  Keep the splint or sling clean and dry. Bathing  Do not take baths, swim, or use a hot tub until your health care provider approves. Ask your health care provider if you may take showers. You may only be allowed to take sponge baths.  If your cast, splint, or sling is not waterproof: ? Do not let it get wet. ? Cover it with a watertight covering when you take a bath or shower.  If you have a sling, remove it for bathing only if your health care provider tells you that it is safe to do so.  Keep the bandage (dressing) dry until your health care provider says it can be removed. Incision care   Follow instructions from your health care provider about how to take care of your incision. Make sure you: ? Wash your hands with soap and water before you change your bandage (dressing). If soap and water are not available, use hand sanitizer. ? Change your dressing as told by your health care provider. ? Leave stitches (sutures), skin glue, or adhesive strips in place. These skin closures may need to stay in place for 2 weeks or longer. If adhesive strip edges start to loosen and curl up, you may trim the loose edges. Do not remove adhesive strips completely unless your health care provider tells you to do that.  Check your incision area every day for signs of infection. Check for: ? Redness. ? More swelling or pain. ? Blood or more fluid. ? Warmth. ? Pus or a bad smell. Managing pain, stiffness, and swelling   If directed, put ice on the injured area. ? If you have a removable splint or sling, remove it as told by your health care provider. ? Put ice in a plastic  bag. ? Place a towel between your skin and the bag or between your cast and the bag. ? Leave the ice on for 20 minutes, 2-3 times a day.  Move your fingers often to avoid stiffness and to lessen swelling.  Raise (elevate) the injured area above the level of your heart while you are sitting or lying down. Driving  Do not drive or use heavy machinery while taking prescription pain medicine.  Do not drive for 24 hours if you were given a sedative during your procedure.  Ask your health care provider when it is safe to drive if you have a cast, splint, or sling on your wrist. Activity  Return to your normal activities as told by your health care provider. Ask your health care provider what activities are safe for you.  Do exercises as told by your health care provider.  Do not lift with your injured wrist until your health care provider approves.  Avoid pulling and pushing. General instructions  Do not put pressure on any part of the cast or splint until it is fully hardened. This may take several hours.  Take over-the-counter and prescription medicines only as told by your health care provider.  Do not use any products that contain nicotine or tobacco, such as cigarettes and e-cigarettes. These can delay bone healing after surgery. If you need help quitting, ask your health  care provider.  If you were prescribed pain medicine, take steps to prevent or treat constipation. Your health care provider may recommend that you: ? Drink enough fluid to keep your urine pale yellow. ? Take over-the-counter or prescription medicines. ? Eat foods that are high in fiber, such as beans, whole grains, and fresh fruits and vegetables. ? Limit foods that are high in fat and processed sugars, such as fried or sweet foods.  Keep all follow-up visits as told by your health care provider. This is important. Contact a health care provider if:  Your cast, splint, or sling is damaged or loose.  Your  pain is not controlled with medicine.  You have a fever.  You have redness around your incision.  You have more swelling or pain around your incision.  You have blood or more fluid coming from your incision.  Your incision feels warm to the touch.  You have pus or a bad smell coming from your incision or your dressing.  You develop a rash. Get help right away if:  Your skin or fingers on your injured arm turn blue or gray.  Your arm feels cold or numb.  You have severe pain in your injured wrist.  You have trouble breathing.  You feel faint or light-headed. Summary  After the procedure, it is common to have pain, swelling, stiffness, and a small amount of drainage from the incision.  You may use ice, elevation, and pain medicine as told by your health care provider to lessen pain and swelling.  Wear your splint or sling as told by your health care provider.  Do not lift with your injured wrist until your health care provider approves. This information is not intended to replace advice given to you by your health care provider. Make sure you discuss any questions you have with your health care provider. Document Revised: 06/16/2017 Document Reviewed: 06/16/2017 Elsevier Patient Education  2020 [employer]  General Anesthesia, Adult, Care After This sheet gives you information about how to care for yourself after your procedure. Your health care provider may also give you more specific instructions. If you have problems or questions, contact your health care provider. What can I expect after the procedure? After the procedure, the following side effects are common:  Pain or discomfort at the IV site.  Nausea.  Vomiting.  Sore throat.  Trouble concentrating.  Feeling cold or chills.  Weak or tired.  Sleepiness and fatigue.  Soreness and body aches. These side effects can affect parts of the body that were not involved in surgery. Follow these instructions  at home:  For at least 24 hours after the procedure:  Have a responsible adult stay with you. It is important to have someone help care for you until you are awake and alert.  Rest as needed.  Do not: ? Participate in activities in which you could fall or become injured. ? Drive. ? Use heavy machinery. ? Drink alcohol. ? Take sleeping pills or medicines that cause drowsiness. ? Make important decisions or sign legal documents. ? Take care of children on your own. Eating and drinking  Follow any instructions from your health care provider about eating or drinking restrictions.  When you feel hungry, start by eating small amounts of foods that are soft and easy to digest (bland), such as toast. Gradually return to your regular diet.  Drink enough fluid to keep your urine pale yellow.  If you vomit, rehydrate by drinking water, juice, or  clear broth. General instructions  If you have sleep apnea, surgery and certain medicines can increase your risk for breathing problems. Follow instructions from your health care provider about wearing your sleep device: ? Anytime you are sleeping, including during daytime naps. ? While taking prescription pain medicines, sleeping medicines, or medicines that make you drowsy.  Return to your normal activities as told by your health care provider. Ask your health care provider what activities are safe for you.  Take over-the-counter and prescription medicines only as told by your health care provider.  If you smoke, do not smoke without supervision.  Keep all follow-up visits as told by your health care provider. This is important. Contact a health care provider if:  You have nausea or vomiting that does not get better with medicine.  You cannot eat or drink without vomiting.  You have pain that does not get better with medicine.  You are unable to pass urine.  You develop a skin rash.  You have a fever.  You have redness around your  IV site that gets worse. Get help right away if:  You have difficulty breathing.  You have chest pain.  You have blood in your urine or stool, or you vomit blood. Summary  After the procedure, it is common to have a sore throat or nausea. It is also common to feel tired.  Have a responsible adult stay with you for the first 24 hours after general anesthesia. It is important to have someone help care for you until you are awake and alert.  When you feel hungry, start by eating small amounts of foods that are soft and easy to digest (bland), such as toast. Gradually return to your regular diet.  Drink enough fluid to keep your urine pale yellow.  Return to your normal activities as told by your health care provider. Ask your health care provider what activities are safe for you. This information is not intended to replace advice given to you by your health care provider. Make sure you discuss any questions you have with your health care provider. Document Revised: 01/30/2017 Document Reviewed: 09/12/2016 Elsevier Patient Education  2020 [employer].

## 2020-02-06 ENCOUNTER — Telehealth: Payer: Self-pay | Admitting: Orthopedic Surgery

## 2020-02-06 NOTE — Telephone Encounter (Signed)
I have submitted the information to insurance to make sure precertifcation is not needed for the upcoming wrist surgery.

## 2020-02-07 ENCOUNTER — Encounter (HOSPITAL_COMMUNITY)
Admission: RE | Admit: 2020-02-07 | Discharge: 2020-02-07 | Disposition: A | Discharge status: Home / Self Care | Payer: Medicaid Other | Source: Ambulatory Visit | Attending: Orthopedic Surgery | Admitting: Orthopedic Surgery

## 2020-02-07 ENCOUNTER — Encounter (HOSPITAL_COMMUNITY): Payer: Self-pay

## 2020-02-07 ENCOUNTER — Other Ambulatory Visit (HOSPITAL_COMMUNITY)
Admission: RE | Admit: 2020-02-07 | Discharge: 2020-02-07 | Disposition: A | Discharge status: Home / Self Care | Payer: Medicaid Other | Source: Ambulatory Visit | Attending: Orthopedic Surgery | Admitting: Orthopedic Surgery

## 2020-02-07 ENCOUNTER — Other Ambulatory Visit: Payer: Self-pay

## 2020-02-07 DIAGNOSIS — Z20822 Contact with and (suspected) exposure to covid-19: Secondary | ICD-10-CM | POA: Insufficient documentation

## 2020-02-07 DIAGNOSIS — Z01812 Encounter for preprocedural laboratory examination: Secondary | ICD-10-CM | POA: Diagnosis not present

## 2020-02-07 HISTORY — DX: Hyperlipidemia, unspecified: E78.5

## 2020-02-07 HISTORY — DX: Type 2 diabetes mellitus without complications: E11.9

## 2020-02-07 LAB — CBC
HCT: 48.1 % — ABNORMAL HIGH (ref 36.0–46.0)
Hemoglobin: 15.5 g/dL — ABNORMAL HIGH (ref 12.0–15.0)
MCH: 29.2 pg (ref 26.0–34.0)
MCHC: 32.2 g/dL (ref 30.0–36.0)
MCV: 90.6 fL (ref 80.0–100.0)
Platelets: 324 10*3/uL (ref 150–400)
RBC: 5.31 MIL/uL — ABNORMAL HIGH (ref 3.87–5.11)
RDW: 13 % (ref 11.5–15.5)
WBC: 13 10*3/uL — ABNORMAL HIGH (ref 4.0–10.5)
nRBC: 0 % (ref 0.0–0.2)

## 2020-02-07 LAB — BASIC METABOLIC PANEL
Anion gap: 11 (ref 5–15)
BUN: 22 mg/dL — ABNORMAL HIGH (ref 6–20)
CO2: 23 mmol/L (ref 22–32)
Calcium: 9 mg/dL (ref 8.9–10.3)
Chloride: 98 mmol/L (ref 98–111)
Creatinine, Ser: 0.68 mg/dL (ref 0.44–1.00)
GFR, Estimated: >60 mL/min (ref >60)
Glucose, Bld: 389 mg/dL — ABNORMAL HIGH (ref 70–99)
Potassium: 4 mmol/L (ref 3.5–5.1)
Sodium: 132 mmol/L — ABNORMAL LOW (ref 135–145)

## 2020-02-07 LAB — HEMOGLOBIN A1C
Hgb A1c MFr Bld: 11.1 % — ABNORMAL HIGH (ref 4.8–5.6)
Mean Plasma Glucose: 271.87 mg/dL

## 2020-02-07 LAB — HCG, SERUM, QUALITATIVE: Preg, Serum: NEGATIVE

## 2020-02-08 LAB — SARS CORONAVIRUS 2 (TAT 6-24 HRS): SARS Coronavirus 2: NEGATIVE

## 2020-02-08 NOTE — Progress Notes (Signed)
Hgb A1C 11.1, Blood Glucose 389 and sodium 132 reported to Dr Dallas Schimke via messenger

## 2020-02-08 NOTE — Telephone Encounter (Signed)
I called [employer] at [phone] and spoke with Porfirio Mylar at first and then was transferred to her supervisor and was told that the case would stay the way it was and it was standard. It could not be moved to another day. Per Toniann Fail its ok to go forward with the surgery on 02/09/2020 at this time.   Toniann Fail will help me look at the site next week and check to see what is going on with the authorization. No other concerns.

## 2020-02-09 ENCOUNTER — Ambulatory Visit (HOSPITAL_COMMUNITY): Payer: Medicaid Other | Admitting: Certified Registered"

## 2020-02-09 ENCOUNTER — Telehealth: Payer: Self-pay | Admitting: Orthopedic Surgery

## 2020-02-09 ENCOUNTER — Ambulatory Visit (HOSPITAL_COMMUNITY): Payer: Medicaid Other

## 2020-02-09 ENCOUNTER — Ambulatory Visit (HOSPITAL_COMMUNITY)
Admission: RE | Admit: 2020-02-09 | Discharge: 2020-02-09 | Disposition: A | Discharge status: Home / Self Care | Payer: Medicaid Other | Attending: Orthopedic Surgery | Admitting: Orthopedic Surgery

## 2020-02-09 ENCOUNTER — Other Ambulatory Visit: Payer: Self-pay

## 2020-02-09 ENCOUNTER — Encounter (HOSPITAL_COMMUNITY)
Admission: RE | Disposition: A | Discharge status: Home / Self Care | Payer: Self-pay | Source: Home / Self Care | Attending: Orthopedic Surgery

## 2020-02-09 ENCOUNTER — Encounter (HOSPITAL_COMMUNITY): Payer: Self-pay | Admitting: Orthopedic Surgery

## 2020-02-09 DIAGNOSIS — Z7984 Long term (current) use of oral hypoglycemic drugs: Secondary | ICD-10-CM | POA: Insufficient documentation

## 2020-02-09 DIAGNOSIS — Z91040 Latex allergy status: Secondary | ICD-10-CM | POA: Insufficient documentation

## 2020-02-09 DIAGNOSIS — Z884 Allergy status to anesthetic agent status: Secondary | ICD-10-CM | POA: Diagnosis not present

## 2020-02-09 DIAGNOSIS — S62301A Unspecified fracture of second metacarpal bone, left hand, initial encounter for closed fracture: Secondary | ICD-10-CM

## 2020-02-09 DIAGNOSIS — T148XXA Other injury of unspecified body region, initial encounter: Secondary | ICD-10-CM

## 2020-02-09 DIAGNOSIS — S52501A Unspecified fracture of the lower end of right radius, initial encounter for closed fracture: Secondary | ICD-10-CM

## 2020-02-09 DIAGNOSIS — F17210 Nicotine dependence, cigarettes, uncomplicated: Secondary | ICD-10-CM | POA: Insufficient documentation

## 2020-02-09 DIAGNOSIS — S52502A Unspecified fracture of the lower end of left radius, initial encounter for closed fracture: Secondary | ICD-10-CM

## 2020-02-09 HISTORY — PX: OPEN REDUCTION INTERNAL FIXATION (ORIF) METACARPAL: SHX6234

## 2020-02-09 HISTORY — PX: OPEN REDUCTION INTERNAL FIXATION (ORIF) DISTAL RADIAL FRACTURE: SHX5989

## 2020-02-09 LAB — GLUCOSE, CAPILLARY
Glucose-Capillary: 268 mg/dL — ABNORMAL HIGH (ref 70–99)
Glucose-Capillary: 181 mg/dL — ABNORMAL HIGH (ref 70–99)

## 2020-02-09 SURGERY — OPEN REDUCTION INTERNAL FIXATION (ORIF) METACARPAL
Anesthesia: General | Site: Finger | Laterality: Left

## 2020-02-09 MED ORDER — ROPIVACAINE HCL 5 MG/ML IJ SOLN
INTRAMUSCULAR | Status: DC | PRN
  Administered 2020-02-09: 30 mL via EPIDURAL

## 2020-02-09 MED ORDER — PROPOFOL 10 MG/ML IV BOLUS
INTRAVENOUS | Status: DC | PRN
  Administered 2020-02-09: 300 mg via INTRAVENOUS

## 2020-02-09 MED ORDER — ONDANSETRON HCL 4 MG PO TABS: 4.0000 mg | ORAL_TABLET | Freq: Three times a day (TID) | ORAL | 0 refills | Status: AC | PRN

## 2020-02-09 MED ORDER — SODIUM CHLORIDE 0.9 % IR SOLN
Status: DC | PRN
  Administered 2020-02-09: 1000 mL

## 2020-02-09 MED ORDER — ASPIRIN EC 81 MG PO TBEC: 81.0000 mg | DELAYED_RELEASE_TABLET | Freq: Two times a day (BID) | ORAL | 0 refills | Status: AC

## 2020-02-09 MED ORDER — CHLORHEXIDINE GLUCONATE 0.12 % MT SOLN
OROMUCOSAL | Status: AC
  Filled 2020-02-09: qty 15

## 2020-02-09 MED ORDER — DEXMEDETOMIDINE (PRECEDEX) IN NS 20 MCG/5ML (4 MCG/ML) IV SYRINGE
PREFILLED_SYRINGE | INTRAVENOUS | Status: AC
  Filled 2020-02-09: qty 5

## 2020-02-09 MED ORDER — FENTANYL CITRATE (PF) 100 MCG/2ML IJ SOLN
INTRAMUSCULAR | Status: DC | PRN
  Administered 2020-02-09 (×2): 50 ug via INTRAVENOUS

## 2020-02-09 MED ORDER — CHLORHEXIDINE GLUCONATE 0.12 % MT SOLN
15.0000 mL | Freq: Once | OROMUCOSAL | Status: AC
  Administered 2020-02-09: 09:00:00 15 mL via OROMUCOSAL

## 2020-02-09 MED ORDER — MIDAZOLAM HCL 5 MG/5ML IJ SOLN
INTRAMUSCULAR | Status: DC | PRN
  Administered 2020-02-09: 2 mg via INTRAVENOUS

## 2020-02-09 MED ORDER — OXYCODONE HCL 5 MG PO TABS: 5.0000 mg | ORAL_TABLET | ORAL | 0 refills | Status: AC | PRN

## 2020-02-09 MED ORDER — PHENYLEPHRINE 40 MCG/ML (10ML) SYRINGE FOR IV PUSH (FOR BLOOD PRESSURE SUPPORT)
PREFILLED_SYRINGE | INTRAVENOUS | Status: AC
  Filled 2020-02-09: qty 10

## 2020-02-09 MED ORDER — CEFAZOLIN SODIUM-DEXTROSE 2-4 GM/100ML-% IV SOLN
2.0000 g | INTRAVENOUS | Status: AC
  Administered 2020-02-09: 11:00:00 2 g via INTRAVENOUS

## 2020-02-09 MED ORDER — FENTANYL CITRATE (PF) 100 MCG/2ML IJ SOLN: 25.0000 ug | INTRAMUSCULAR | Status: DC | PRN

## 2020-02-09 MED ORDER — DEXAMETHASONE SODIUM PHOSPHATE 4 MG/ML IJ SOLN
INTRAMUSCULAR | Status: DC | PRN
  Administered 2020-02-09: 4 mg via INTRAVENOUS

## 2020-02-09 MED ORDER — PROPOFOL 10 MG/ML IV BOLUS
INTRAVENOUS | Status: AC
  Filled 2020-02-09: qty 20

## 2020-02-09 MED ORDER — CEFAZOLIN SODIUM-DEXTROSE 2-4 GM/100ML-% IV SOLN
INTRAVENOUS | Status: AC
  Filled 2020-02-09: qty 100

## 2020-02-09 MED ORDER — LIDOCAINE HCL (CARDIAC) PF 50 MG/5ML IV SOSY
PREFILLED_SYRINGE | INTRAVENOUS | Status: DC | PRN
  Administered 2020-02-09: 1 mL via INTRAVENOUS

## 2020-02-09 MED ORDER — CELECOXIB 100 MG PO CAPS: 100.0000 mg | ORAL_CAPSULE | Freq: Every day | ORAL | 0 refills | Status: DC

## 2020-02-09 MED ORDER — ONDANSETRON HCL 4 MG/2ML IJ SOLN
INTRAMUSCULAR | Status: AC
  Filled 2020-02-09: qty 2

## 2020-02-09 MED ORDER — ONDANSETRON HCL 4 MG/2ML IJ SOLN: 4.0000 mg | Freq: Once | INTRAMUSCULAR | Status: DC | PRN

## 2020-02-09 MED ORDER — DEXAMETHASONE SODIUM PHOSPHATE 10 MG/ML IJ SOLN
INTRAMUSCULAR | Status: AC
  Filled 2020-02-09: qty 1

## 2020-02-09 MED ORDER — ROPIVACAINE HCL 5 MG/ML IJ SOLN
INTRAMUSCULAR | Status: AC
  Filled 2020-02-09: qty 30

## 2020-02-09 MED ORDER — ONDANSETRON HCL 4 MG/2ML IJ SOLN
INTRAMUSCULAR | Status: DC | PRN
  Administered 2020-02-09: 4 mg via INTRAVENOUS

## 2020-02-09 MED ORDER — FENTANYL CITRATE (PF) 100 MCG/2ML IJ SOLN
INTRAMUSCULAR | Status: AC
  Filled 2020-02-09: qty 2

## 2020-02-09 MED ORDER — GABAPENTIN 100 MG PO CAPS: 100.0000 mg | ORAL_CAPSULE | Freq: Three times a day (TID) | ORAL | 0 refills | Status: DC

## 2020-02-09 MED ORDER — DEXMEDETOMIDINE HCL 200 MCG/2ML IV SOLN
INTRAVENOUS | Status: DC | PRN
  Administered 2020-02-09: 20 ug via INTRAVENOUS

## 2020-02-09 MED ORDER — PHENYLEPHRINE HCL (PRESSORS) 10 MG/ML IV SOLN
INTRAVENOUS | Status: DC | PRN
  Administered 2020-02-09: 40 ug via INTRAVENOUS
  Administered 2020-02-09 (×3): 80 ug via INTRAVENOUS

## 2020-02-09 MED ORDER — ORAL CARE MOUTH RINSE: 15.0000 mL | Freq: Once | OROMUCOSAL | Status: AC

## 2020-02-09 MED ORDER — MIDAZOLAM HCL 2 MG/2ML IJ SOLN
INTRAMUSCULAR | Status: AC
  Filled 2020-02-09: qty 2

## 2020-02-09 MED ORDER — LACTATED RINGERS IV SOLN: INTRAVENOUS | Status: DC

## 2020-02-09 SURGICAL SUPPLY — 84 items
APL PRP STRL LF DISP 70% ISPRP (MISCELLANEOUS) ×2
BANDAGE ESMARK 4X12 BL STRL LF (DISPOSABLE) ×2 IMPLANT
BB-TAK SMALL (MISCELLANEOUS) ×6
BIT DRILL 1.7 (BIT) ×3 IMPLANT
BIT DRILL 2.5 CANN REUSE (DRILL) ×3 IMPLANT
BIT DRILL CALIBR MINI 1.2 (BIT) ×3 IMPLANT
BIT DRILL CALIBR MINI 1.5 (BIT) ×3 IMPLANT
BIT DRILL CALIBR MINI 2.0 (BIT) ×3 IMPLANT
BNDG CMPR 12X4 ELC STRL LF (DISPOSABLE) ×2
BNDG CMPR STD VLCR NS LF 5.8X3 (GAUZE/BANDAGES/DRESSINGS) ×6
BNDG COHESIVE 2X5 TAN STRL LF (GAUZE/BANDAGES/DRESSINGS) ×3 IMPLANT
BNDG COHESIVE 4X5 TAN STRL (GAUZE/BANDAGES/DRESSINGS) ×3 IMPLANT
BNDG ELASTIC 3X5.8 VLCR NS LF (GAUZE/BANDAGES/DRESSINGS) ×9 IMPLANT
BNDG ESMARK 4X12 BLUE STRL LF (DISPOSABLE) ×3
BNDG GAUZE ELAST 4 BULKY (GAUZE/BANDAGES/DRESSINGS) ×3 IMPLANT
CAP PIN PROTECTOR ORTHO WHT (CAP) IMPLANT
CHLORAPREP W/TINT 26 (MISCELLANEOUS) ×3 IMPLANT
CLOTH BEACON ORANGE TIMEOUT ST (SAFETY) ×3 IMPLANT
COVER LIGHT HANDLE STERIS (MISCELLANEOUS) ×6 IMPLANT
COVER WAND RF STERILE (DRAPES) ×3 IMPLANT
CUFF TOURN SGL QUICK 18X4 (TOURNIQUET CUFF) ×3 IMPLANT
DECANTER SPIKE VIAL GLASS SM (MISCELLANEOUS) ×3 IMPLANT
DRAPE C-ARM FOLDED MOBILE STRL (DRAPES) ×3 IMPLANT
DRSG XEROFORM 1X8 (GAUZE/BANDAGES/DRESSINGS) ×3 IMPLANT
ELECT REM PT RETURN 9FT ADLT (ELECTROSURGICAL) ×3
ELECTRODE REM PT RTRN 9FT ADLT (ELECTROSURGICAL) ×2 IMPLANT
FIXATION BB TAK SMALL (MISCELLANEOUS) ×4 IMPLANT
GAUZE SPONGE 4X4 12PLY STRL (GAUZE/BANDAGES/DRESSINGS) ×3 IMPLANT
GAUZE SPONGE 4X4 12PLY STRL LF (GAUZE/BANDAGES/DRESSINGS) ×3 IMPLANT
GAUZE XEROFORM 1X8 LF (GAUZE/BANDAGES/DRESSINGS) ×6 IMPLANT
GLOVE BIOGEL PI IND STRL 7.0 (GLOVE) ×6 IMPLANT
GLOVE BIOGEL PI IND STRL 8 (GLOVE) ×2 IMPLANT
GLOVE BIOGEL PI INDICATOR 7.0 (GLOVE) ×3
GLOVE BIOGEL PI INDICATOR 8 (GLOVE) ×1
GLOVE SKINSENSE NS SZ8.0 LF (GLOVE) ×3
GLOVE SKINSENSE STRL SZ8.0 LF (GLOVE) ×6 IMPLANT
GLOVE SURG SS PI 6.5 STRL IVOR (GLOVE) ×3 IMPLANT
GLOVE SURG SS PI 7.0 STRL IVOR (GLOVE) ×3 IMPLANT
GOWN STRL REUS W/ TWL XL LVL3 (GOWN DISPOSABLE) ×2 IMPLANT
GOWN STRL REUS W/TWL LRG LVL3 (GOWN DISPOSABLE) ×6 IMPLANT
GOWN STRL REUS W/TWL XL LVL3 (GOWN DISPOSABLE) ×3
GUIDEWIRE 1.35MM (WIRE) ×3 IMPLANT
GUIDEWIRE TROCAR 1.6X51 (ORTHOPEDIC DISPOSABLE SUPPLIES) ×3 IMPLANT
K-WIRE BB-TAK (WIRE) ×3
KIT TURNOVER KIT A (KITS) ×3 IMPLANT
KWIRE BB-TAK (WIRE) ×2 IMPLANT
MANIFOLD NEPTUNE II (INSTRUMENTS) ×3 IMPLANT
NEEDLE HYPO 21X1.5 SAFETY (NEEDLE) ×3 IMPLANT
NS IRRIG 1000ML POUR BTL (IV SOLUTION) ×3 IMPLANT
PACK BASIC LIMB (CUSTOM PROCEDURE TRAY) ×3 IMPLANT
PAD ARMBOARD 7.5X6 YLW CONV (MISCELLANEOUS) ×3 IMPLANT
PAD CAST 4YDX4 CTTN HI CHSV (CAST SUPPLIES) ×4 IMPLANT
PADDING CAST COTTON 4X4 STRL (CAST SUPPLIES) ×6
PIN CAPS ORTHO GREEN .062 (PIN) IMPLANT
PLATE NARROW 3H (Plate) ×3 IMPLANT
PLATE T 1.6 8H (Plate) ×3 IMPLANT
PUTTY DBX 1CC (Putty) ×3 IMPLANT
PUTTY DBX 1CC DEPUY (Putty) ×2 IMPLANT
SCREW CORT LP 1.6X10 (Screw) ×9 IMPLANT
SCREW CORT LP 1.6X14 (Screw) ×3 IMPLANT
SCREW CORT LP 1.6X15 (Screw) ×6 IMPLANT
SCREW CORTEX LP 2.4X14 (Screw) ×3 IMPLANT
SCREW CORTEX LP 2.4X18 (Screw) ×3 IMPLANT
SCREW CORTEX LP 2.4X20 (Screw) ×3 IMPLANT
SCREW LOCK 14X2.4XSTVA (Screw) ×2 IMPLANT
SCREW LOCKING 2.4X14 (Screw) ×3 IMPLANT
SCREW LOW PRO NONLOCK 2.0X15 (Screw) ×3 IMPLANT
SCREW NLOCK 14X3.5XLOPRFL NS (Screw) ×6 IMPLANT
SCREW NONLOCK 3.5X14 (Screw) ×9 IMPLANT
SCREW VAL TI 2.4X12MM (Screw) ×3 IMPLANT
SCREW VAL TI 2.4X18 (Screw) ×3 IMPLANT
SCREW VAR TI 2.4X20 (Screw) ×3 IMPLANT
SET BASIN LINEN APH (SET/KITS/TRAYS/PACK) ×3 IMPLANT
STAPLER VISISTAT 35W (STAPLE) IMPLANT
SUT ETHILON 3 0 FSL (SUTURE) ×3 IMPLANT
SUT MON AB 0 CT1 (SUTURE) IMPLANT
SUT MON AB 2-0 SH 27 (SUTURE) ×3
SUT MON AB 2-0 SH27 (SUTURE) ×2 IMPLANT
SUT PROLENE 3 0 PS 2 (SUTURE) ×9 IMPLANT
SUT VIC AB 3-0 SH 27 (SUTURE) ×3
SUT VIC AB 3-0 SH 27X BRD (SUTURE) ×2 IMPLANT
SYR BULB IRRIG 60ML STRL (SYRINGE) ×3 IMPLANT
SYR CONTROL 10ML LL (SYRINGE) ×3 IMPLANT
WATER STERILE IRR 1000ML POUR (IV SOLUTION) ×3 IMPLANT

## 2020-02-09 NOTE — Anesthesia Postprocedure Evaluation (Signed)
Anesthesia Post Note  Patient: Heather Richard  Procedure(s) Performed: OPEN REDUCTION INTERNAL FIXATION (ORIF) METACARPAL (Left Finger) OPEN REDUCTION INTERNAL FIXATION (ORIF) DISTAL RADIAL FRACTURE (Left )  Anesthesia Type: General Level of consciousness: oriented, awake, awake and alert and patient cooperative Pain management: pain level controlled Vital Signs Assessment: post-procedure vital signs reviewed and stable Respiratory status: spontaneous breathing, respiratory function stable and nonlabored ventilation Cardiovascular status: blood pressure returned to baseline and stable Postop Assessment: no headache and no backache Anesthetic complications: no   No complications documented.   Last Vitals:  Vitals:   02/09/20 0901 02/09/20 0928  BP: (!) 147/88 131/84  Pulse:    Resp:    Temp:    SpO2: 98%     Last Pain:  Vitals:   02/09/20 0837  TempSrc: Oral  PainSc: 4                  Brynda Peon

## 2020-02-09 NOTE — Discharge Instructions (Signed)
Mark A. Dallas Schimkeairns, MD MS [facility] [address] [place],  [place]  [id number] Phone: [phone] Fax: [phone]   POST-OPERATIVE INSTRUCTIONS - UPPER EXTREMITY   WOUND CARE ? Please keep splint clean dry and intact until followup.  ? You may shower on Post-Op Day #2.  ? You must keep splint dry during this process and may find that a plastic bag taped around the arm or alternatively a towel based bath may be a better option.   ? If you get your splint wet or if it is damaged please contact our clinic.  EXERCISES ? Due to your splint being in place you will not be able to bear weight through your extremity.   ? DO NOT PUT ANY WEIGHT ON YOUR OPERATIVE ARM    REGIONAL ANESTHESIA (NERVE BLOCKS) . The anesthesia team may have performed a nerve block for you if safe in the setting of your care.  This is a great tool used to minimize pain.  Typically the block may start wearing off overnight but the long acting medicine may last for 3-4 days.  The nerve block wearing off can be a challenging period but please utilize your as needed pain medications to try and manage this period.    POST-OP MEDICATIONS- Multimodal approach to pain control . In general your pain will be controlled with a combination of substances.  Prescriptions unless otherwise discussed are electronically sent to your pharmacy.  This is a carefully made plan we use to minimize narcotic use.     - Meloxicam OR Celebrex - Anti-inflammatory medication taken on a scheduled basis  - Acetaminophen - Non-narcotic pain medicine taken on a scheduled basis   - Oxycodone - This is a strong narcotic, to be used only on an "as needed" basis for pain.  -  Aspirin 81mg  - This medicine is used to minimize the risk of blood clots after surgery.             -          Zofran - take as needed for nausea   -          Gabapentin - post nerve block pain   FOLLOW-UP ? If you develop a Fever (>101.5), Redness or  Drainage from the surgical incision site, please call our office to arrange for an evaluation. ? Please call the office to schedule a follow-up appointment for your incision check if you do not already have one, 10-14 days post-operatively.  IF YOU HAVE ANY QUESTIONS, PLEASE FEEL FREE TO CALL OUR OFFICE.  HELPFUL INFORMATION  ? If you had a block, it will wear off between 8-24 hrs postop typically.  This is period when your pain may go from nearly zero to the pain you would have had postop without the block.  This is an abrupt transition but nothing dangerous is happening.  You may take an extra dose of narcotic when this happens.  ? You should wean off your narcotic medicines as soon as you are able.  Most patients will be off or using minimal narcotics before their first postop appointment.   ? Elevating your arm will help with swelling and pain control.  You are encouraged to elevate your arm as much as possible in the first couple of weeks following surgery.  Imagine a drop of water on your finger, and your goal is to get that water back to your heart.  ? We suggest you use  the pain medication the first night prior to going to bed, in order to ease any pain when the anesthesia wears off. You should avoid taking pain medications on an empty stomach as it will make you nauseous.  ? Do not drink alcoholic beverages or take illicit drugs when taking pain medications.  ? In most states it is against the law to drive while you are in a splint or sling.  And certainly against the law to drive while taking narcotics.  ? You may return to work/school in the next couple of days when you feel up to it.   ? Pain medication may make you constipated.  Below are a few solutions to try in this order: - Decrease the amount of pain medication if you aren't having pain. - Drink lots of decaffeinated fluids. - Drink prune juice and/or each dried prunes  o If the first 3 don't work start with additional  solutions - Take Colace - an over-the-counter stool softener - Take Senokot - an over-the-counter laxative - Take Miralax - a stronger over-the-counter laxative          Regional Anesthesia  Regional anesthesia is a method used to temporarily block feeling in one area of the body. You may have regional anesthesia before a medical procedure or surgery. A health care provider who specializes in giving anesthesia (anesthesiologist) injects a type of medicine near a nerve or a group of nerves. This medicine makes that area of the body numb. Regional anesthesia allows you to be awake during the procedure or surgery but keeps you from feeling pain in the affected area. There are three types of regional anesthesia:  Spinal anesthesia. This is a one-time injection of medicine into the fluid that surrounds your spinal cord. This numbs the area below and slightly above the injection site.  Epidural anesthesia. This is another medicine that may be placed into your back, but just outside of the protective tissue that covers your spinal cord. Instead of a one-time injection, the medicine is often given gradually over time through a small tube (catheter)that remains in your back for as long as pain control is needed.  Peripheral nerve block. This is an injection that is given in an area of the body other than the spine to block all feeling below the injection site. Peripheral nerve blocks may be given as a single injection before your procedure or may be given through a catheter for as long as you need pain control. Regional anesthesia can be used alone or in combination with other types of anesthesia. Compared to using medicine that makes you fall asleep (general anesthetic), regional anesthesia has many benefits, such as:  Improved pain control after your surgery.  Less nausea, vomiting, or drowsiness after surgery.  A faster recovery. Tell a health care provider about:  Any allergies you  have.  All medicines you are taking, including vitamins, herbs, eye drops, creams, and over-the-counter medicines.  Any use of drugs, alcohol, or tobacco.  Any problems you or family members have had with anesthetic medicines.  Any blood disorders you have.  Any surgeries you have had.  Any medical conditions you have or have had, especially heart failure, chronic obstructive pulmonary disease (COPD), or sleep apnea.  Whether you are pregnant or may be pregnant. What are the risks? Generally, this is a safe procedure. However, problems may occur, including:  Pain.  Nausea.  Vomiting.  Itching.  Low blood pressure.  Headache.  Nerve damage.  Infection.  Bleeding around the injection site.  Trouble urinating.  Allergic reactions to medicine. What happens before the procedure? Staying hydrated Follow instructions from your health care provider about hydration, which may include:  Up to 2 hours before the procedure - you may continue to drink clear liquids, such as water, clear fruit juice, black coffee, and plain tea. Eating and drinking restrictions Follow instructions from your health care provider about eating and drinking, which may include:  8 hours before the procedure - stop eating heavy meals or foods, such as meat, fried foods, or fatty foods.  6 hours before the procedure - stop eating light meals or foods, such as toast or cereal.  6 hours before the procedure - stop drinking milk or drinks that contain milk.  2 hours before the procedure - stop drinking clear liquids. Medicines Ask your health care provider about:  Changing or stopping your regular medicines. This is especially important if you are taking diabetes medicines or blood thinners.  Taking medicines such as aspirin and ibuprofen. These medicines can thin your blood. Do not take these medicines unless your health care provider tells you to take them.  Taking over-the-counter medicines,  vitamins, herbs, and supplements. General instructions  Plan to have someone take you home from the hospital or clinic.  If you will be going home right after the procedure, plan to have someone with you for 24 hours.  You may need to have blood or imaging tests.  Ask your health care provider what steps will be taken to help prevent infection. These may include washing skin with a germ-killing soap.  If you use a sleep apnea device, ask your health care provider whether you should bring it with you on the day of your surgery. What happens during the procedure?  Depending on the medical procedure you are having done, an IV may be inserted into one of your veins.  The anesthesiologist will do a physical exam to find the best location to give the regional anesthesia. To locate the nerve, he or she may also use: ? A device that activates the nerve and causes your muscles to twitch (nerve stimulator). ? An imaging tool that uses sound waves to create images of the area (ultrasound).  You may be given a medicine to help you relax (sedative).  A medicine called a local anesthetic may be injected to numb the area where the regional anesthetic will be injected.  You will get regional anesthesia by injection or through a catheter.  The anesthesiologist will check to make sure the medicine is working before the rest of your medical procedure begins.  Depending on the type of regional anesthesia you received, you may have a small bandage (dressing) placed over the injection site. The procedure may vary among health care providers and hospitals. What can I expect after the procedure? After your procedure, it is common to have:  Sleepiness.  Nausea.  Itching.  Numbness.  Shivering or feeling cold. Your blood pressure, heart rate, breathing rate, and blood oxygen level will be monitored until you leave the hospital or clinic. Follow these instructions at home:  Do not drive for 24  hours if you were given a sedative during your procedure.  Take over-the-counter and prescription medicines only as told by your health care provider.  Do not drive, exercise, or do any other activities that require coordination for 24 hours or as told by your health care provider. Ask your health care provider when you can return  to your usual activities.  Drink enough fluid to keep your urine pale yellow.  If you had a dressing placed over the injection site, only remove it when told to do so by your health care provider.  Keep all follow-up visits as told by your health care provider. This is important. Contact a health care provider if you:  Continue to have nausea and vomiting for more than 1 day.  Develop a rash.  Have trouble urinating. Get help right away if you:  Have bleeding from the injection site or bleeding under the skin at the injection site.  Have redness, swelling, or pain around your injection site.  Have a fever.  Develop a headache.  Develop new numbness or weakness. Summary  Regional anesthesia is a method used to temporarily block feeling in one area of the body. It may be done to block pain during a medical procedure or surgery.  Follow instructions from your health care provider about taking medicines and about eating and drinking before the procedure.  Ask your health care provider when you can return to your usual activities after the procedure. This information is not intended to replace advice given to you by your health care provider. Make sure you discuss any questions you have with your health care provider. Document Revised: 03/15/2018 Document Reviewed: 03/15/2018 Elsevier Patient Education  2020 Elsevier Inc.      General Anesthesia, Adult, Care After This sheet gives you information about how to care for yourself after your procedure. Your health care provider may also give you more specific instructions. If you have problems or  questions, contact your health care provider. What can I expect after the procedure? After the procedure, the following side effects are common:  Pain or discomfort at the IV site.  Nausea.  Vomiting.  Sore throat.  Trouble concentrating.  Feeling cold or chills.  Weak or tired.  Sleepiness and fatigue.  Soreness and body aches. These side effects can affect parts of the body that were not involved in surgery. Follow these instructions at home:  For at least 24 hours after the procedure:  Have a responsible adult stay with you. It is important to have someone help care for you until you are awake and alert.  Rest as needed.  Do not: ? Participate in activities in which you could fall or become injured. ? Drive. ? Use heavy machinery. ? Drink alcohol. ? Take sleeping pills or medicines that cause drowsiness. ? Make important decisions or sign legal documents. ? Take care of children on your own. Eating and drinking  Follow any instructions from your health care provider about eating or drinking restrictions.  When you feel hungry, start by eating small amounts of foods that are soft and easy to digest (bland), such as toast. Gradually return to your regular diet.  Drink enough fluid to keep your urine pale yellow.  If you vomit, rehydrate by drinking water, juice, or clear broth. General instructions  If you have sleep apnea, surgery and certain medicines can increase your risk for breathing problems. Follow instructions from your health care provider about wearing your sleep device: ? Anytime you are sleeping, including during daytime naps. ? While taking prescription pain medicines, sleeping medicines, or medicines that make you drowsy.  Return to your normal activities as told by your health care provider. Ask your health care provider what activities are safe for you.  Take over-the-counter and prescription medicines only as told by your health care  provider.  If you smoke, do not smoke without supervision.  Keep all follow-up visits as told by your health care provider. This is important. Contact a health care provider if:  You have nausea or vomiting that does not get better with medicine.  You cannot eat or drink without vomiting.  You have pain that does not get better with medicine.  You are unable to pass urine.  You develop a skin rash.  You have a fever.  You have redness around your IV site that gets worse. Get help right away if:  You have difficulty breathing.  You have chest pain.  You have blood in your urine or stool, or you vomit blood. Summary  After the procedure, it is common to have a sore throat or nausea. It is also common to feel tired.  Have a responsible adult stay with you for the first 24 hours after general anesthesia. It is important to have someone help care for you until you are awake and alert.  When you feel hungry, start by eating small amounts of foods that are soft and easy to digest (bland), such as toast. Gradually return to your regular diet.  Drink enough fluid to keep your urine pale yellow.  Return to your normal activities as told by your health care provider. Ask your health care provider what activities are safe for you. This information is not intended to replace advice given to you by your health care provider. Make sure you discuss any questions you have with your health care provider. Document Revised: 01/30/2017 Document Reviewed: 09/12/2016 Elsevier Patient Education  2020 Elsevier Inc.      Oxycodone tablets or capsules What is this medicine? OXYCODONE (ox i KOE done) is a pain reliever. It is used to treat moderate to severe pain. This medicine may be used for other purposes; ask your health care provider or pharmacist if you have questions. COMMON BRAND NAME(S): Dazidox, Endocodone, Oxaydo, OXECTA, OxyIR, Percolone, Roxicodone, Roxybond What should I tell my  health care provider before I take this medicine? They need to know if you have any of these conditions:  Addison's disease  brain tumor  head injury  heart disease  history of drug or alcohol abuse problem  if you often drink alcohol  kidney disease  liver disease  lung or breathing disease, like asthma  mental illness  pancreatic disease  seizures  thyroid disease  an unusual or allergic reaction to oxycodone, codeine, hydrocodone, morphine, other medicines, foods, dyes, or preservatives  pregnant or trying to get pregnant  breast-feeding How should I use this medicine? Take this medicine by mouth with a glass of water. Follow the directions on the prescription label. You can take it with or without food. If it upsets your stomach, take it with food. Take your medicine at regular intervals. Do not take it more often than directed. Do not stop taking except on your doctor's advice. Some brands of this medicine, like Oxecta, have special instructions. Ask your doctor or pharmacist if these directions are for you: Do not cut, crush or chew this medicine. Swallow only one tablet at a time. Do not wet, soak, or lick the tablet before you take it. A special MedGuide will be given to you by the pharmacist with each prescription and refill. Be sure to read this information carefully each time. Talk to your pediatrician regarding the use of this medicine in children. Special care may be needed. Overdosage: If you think you have  taken too much of this medicine contact a poison control center or emergency room at once. NOTE: This medicine is only for you. Do not share this medicine with others. What if I miss a dose? If you miss a dose, take it as soon as you can. If it is almost time for your next dose, take only that dose. Do not take double or extra doses. What may interact with this medicine? This medicine may interact with the following  medications:  alcohol  antihistamines for allergy, cough and cold  antiviral medicines for HIV or AIDS  atropine  certain antibiotics like clarithromycin, erythromycin, linezolid, rifampin  certain medicines for anxiety or sleep  certain medicines for bladder problems like oxybutynin, tolterodine  certain medicines for depression like amitriptyline, fluoxetine, sertraline  certain medicines for fungal infections like ketoconazole, itraconazole, voriconazole  certain medicines for migraine headache like almotriptan, eletriptan, frovatriptan, naratriptan, rizatriptan, sumatriptan, zolmitriptan  certain medicines for nausea or vomiting like dolasetron, ondansetron, palonosetron  certain medicines for Parkinson's disease like benztropine, trihexyphenidyl  certain medicines for seizures like phenobarbital, phenytoin, primidone  certain medicines for stomach problems like dicyclomine, hyoscyamine  certain medicines for travel sickness like scopolamine  diuretics  general anesthetics like halothane, isoflurane, methoxyflurane, propofol  ipratropium  local anesthetics like lidocaine, pramoxine, tetracaine  MAOIs like Carbex, Eldepryl, Marplan, Nardil, and Parnate  medicines that relax muscles for surgery  methylene blue  nilotinib  other narcotic medicines for pain or cough  phenothiazines like chlorpromazine, mesoridazine, prochlorperazine, thioridazine This list may not describe all possible interactions. Give your health care provider a list of all the medicines, herbs, non-prescription drugs, or dietary supplements you use. Also tell them if you smoke, drink alcohol, or use illegal drugs. Some items may interact with your medicine. What should I watch for while using this medicine? Tell your health care provider if your pain does not go away, if it gets worse, or if you have new or a different type of pain. You may develop tolerance to this drug. Tolerance means that  you will need a higher dose of the drug for pain relief. Tolerance is normal and is expected if you take this drug for a long time. There are different types of narcotic drugs (opioids) for pain. If you take more than one type at the same time, you may have more side effects. Give your health care provider a list of all drugs you use. He or she will tell you how much drug to take. Do not take more drug than directed. Get emergency help right away if you have problems breathing. Do not suddenly stop taking your drug because you may develop a severe reaction. Your body becomes used to the drug. This does NOT mean you are addicted. Addiction is a behavior related to getting and using a drug for a nonmedical reason. If you have pain, you have a medical reason to take pain drug. Your health care provider will tell you how much drug to take. If your health care provider wants you to stop the drug, the dose will be slowly lowered over time to avoid any side effects. Talk to your health care provider about naloxone and how to get it. Naloxone is an emergency drug used for an opioid overdose. An overdose can happen if you take too much opioid. It can also happen if an opioid is taken with some other drugs or substances, like alcohol. Know the symptoms of an overdose, like trouble breathing, unusually tired or  sleepy, or not being able to respond or wake up. Make sure to tell caregivers and close contacts where it is stored. Make sure they know how to use it. After naloxone is given, you must get emergency help right away. Naloxone is a temporary treatment. Repeat doses may be needed. You may get drowsy or dizzy. Do not drive, use machinery, or do anything that needs mental alertness until you know how this drug affects you. Do not stand up or sit up quickly, especially if you are an older patient. This reduces the risk of dizzy or fainting spells. Alcohol may interfere with the effect of this drug. Avoid alcoholic  drinks. This drug will cause constipation. If you do not have a bowel movement for 3 days, call your health care provider. Your mouth may get dry. Chewing sugarless gum or sucking hard candy and drinking plenty of water may help. Contact your health care provider if the problem does not go away or is severe. The tablet shell for some brands of this drug does not dissolve. This is normal. The tablet shell may appear whole in the stool. This is not a cause for concern. What side effects may I notice from receiving this medicine? Side effects that you should report to your doctor or health care professional as soon as possible:  allergic reactions like skin rash, itching or hives, swelling of the face, lips, or tongue  breathing problems  confusion  signs and symptoms of low blood pressure like dizziness; feeling faint or lightheaded, falls; unusually weak or tired  trouble passing urine or change in the amount of urine  trouble swallowing Side effects that usually do not require medical attention (report to your doctor or health care professional if they continue or are bothersome):  constipation  dry mouth  nausea, vomiting  tiredness This list may not describe all possible side effects. Call your doctor for medical advice about side effects. You may report side effects to FDA at 1-800-FDA-1088. Where should I keep my medicine? Keep out of the reach of children. This medicine can be abused. Keep your medicine in a safe place to protect it from theft. Do not share this medicine with anyone. Selling or giving away this medicine is dangerous and against the law. Store at room temperature between 15 and 30 degrees C (59 and 86 degrees F). Protect from light. Keep container tightly closed. This medicine may cause harm and death if it is taken by other adults, children, or pets. Return medicine that has not been used to an official disposal site. Contact the DEA at 571-459-4337 or your  city/county government to find a site. If you cannot return the medicine, flush it down the toilet. Do not use the medicine after the expiration date. NOTE: This sheet is a summary. It may not cover all possible information. If you have questions about this medicine, talk to your doctor, pharmacist, or health care provider.  2020 Elsevier/Gold Standard (2018-09-07 12:47:59)     Celecoxib capsules What is this medicine? CELECOXIB (sell a KOX ib) is a non-steroidal anti-inflammatory drug (NSAID). This medicine is used to treat arthritis and ankylosing spondylitis. It may be also used for pain or painful monthly periods. This medicine may be used for other purposes; ask your health care provider or pharmacist if you have questions. COMMON BRAND NAME(S): Celebrex What should I tell my health care provider before I take this medicine? They need to know if you have any of these conditions:  cigarette smoker  coronary artery bypass graft (CABG) surgery within the past 2 weeks  drink more than 3 alcohol-containing drinks a day  heart disease  high blood pressure  history of stomach bleeding  kidney disease  liver disease  lung or breathing disease, like asthma  an unusual or allergic reaction to celecoxib, sulfa drugs, aspirin, other NSAIDs, other medicines, foods, dyes, or preservatives  pregnant or trying to get pregnant  breast-feeding How should I use this medicine? Take this medicine by mouth with a glass of water. Follow the directions on the prescription label. Take your medicine at regular intervals. Do not take it more often than directed. Do not stop taking except on your doctor's advice. A special MedGuide will be given to you by the pharmacist with each prescription and refill. Be sure to read this information carefully each time. Talk to your pediatrician regarding the use of this medicine in children. While this drug may be prescribed for children as young as 2 years  for selected conditions, precautions do apply. Overdosage: If you think you have taken too much of this medicine contact a poison control center or emergency room at once. NOTE: This medicine is only for you. Do not share this medicine with others. What if I miss a dose? If you miss a dose, take it as soon as you can. If it is almost time for your next dose, take only that dose. Do not take double or extra doses. What may interact with this medicine? Do not take this medicine with any of the following medications:  cidofovir  ketorolac  thioridazine This medicine may also interact with the following medications:  alcohol  aspirin and aspirin-like medicines  atomoxetine  certain medicines for blood pressure, heart disease, irregular heart beat  certain medicines for depression, anxiety, or psychotic disorders  certain medicines that treat or prevent blood clots like warfarin, enoxaparin, dalteparin, apixaban, dabigatran, and rivaroxaban  cyclosporine  digoxin  diuretics  fluconazole  lithium  methotrexate  other NSAIDs, medicines for pain and inflammation, like ibuprofen or naproxen  pemetrexed  rifampin  steroid medicines like prednisone or cortisone This list may not describe all possible interactions. Give your health care provider a list of all the medicines, herbs, non-prescription drugs, or dietary supplements you use. Also tell them if you smoke, drink alcohol, or use illegal drugs. Some items may interact with your medicine. What should I watch for while using this medicine? Visit your health care professional for regular checks on your progress. Tell your health care professional if your symptoms do not start to get better or if they get worse. This medicine may cause serious skin reactions. They can happen weeks to months after starting the medicine. Contact your healthcare provider right away if you notice fevers or flu-like symptoms with a rash. The rash  may be red or purple and then turn into blisters or peeling of the skin. Or, you might notice a red rash with swelling of the face, lips or lymph nodes in your neck or under your arms. Do not take other medicines that contain aspirin, ibuprofen, or naproxen with this medicine. Side effects such as stomach upset, nausea, or ulcers may be more likely to occur. Many non-prescription medicines contain aspirin, ibuprofen, or naproxen. Always read labels carefully. This medicine can cause ulcers and bleeding in the stomach and intestines at any time during treatment. This can happen with no warning and may cause death. Smoking, drinking alcohol, older age, and poor  health can also increase risks. Call your health care professional right away if you have stomach pain or blood in your vomit or stool. This medicine does not prevent a heart attack or stroke. This medicine may increase the chance of a heart attack or stroke. The chance may increase the longer you use this medicine or if you have heart disease. If you take aspirin to prevent a heart attack or stroke, talk to your health care professional about using this medicine. This medicine may increase your risk to bruise or bleed. Call your health care professional if you notice any unusual bleeding. What side effects may I notice from receiving this medicine? Side effects that you should report to your doctor or health care professional as soon as possible:  allergic reactions like skin rash, itching or hives, swelling of the face, lips, or tongue  rash, fever, and swollen lymph nodes  redness, blistering, peeling or loosening of the skin, including inside the mouth  signs and symptoms of a blood clot such as chest pain; shortness of breath; pain, swelling, or warmth in the leg  signs and symptoms of a stroke like changes in vision; confusion; trouble speaking or understanding; severe headaches; sudden numbness or weakness of the face, arm or leg; trouble  walking; dizziness; loss of balance or coordination  signs and symptoms of bleeding such as bloody or black, tarry stools; red or dark brown urine; spitting up blood or brown material that looks like coffee grounds; red spots on the skin; unusual bruising or bleeding from the eyes, gums, or nose  signs and symptoms of kidney injury like trouble passing urine or change in the amount of urine  signs and symptoms of liver injury like dark yellow or brown urine; general ill feeling or flu-like symptoms; light-colored stools; loss of appetite; nausea; right upper belly pain; unusually weak or tired; yellowing of the eyes or skin  signs and symptoms of low red blood cells or anemia such as unusually weak or tired; feeling faint or lightheaded; falls; breathing problems Side effects that usually do not require medical attention (report to your doctor or health care professional if they continue or are bothersome):  diarrhea  gas  headache  nausea, vomiting  trouble sleeping  upset stomach This list may not describe all possible side effects. Call your doctor for medical advice about side effects. You may report side effects to FDA at 1-800-FDA-1088. Where should I keep my medicine? Keep out of the reach of children. Store at room temperature between 15 and 30 degrees C (59 and 86 degrees F). Throw away any unused medicine after the expiration date. NOTE: This sheet is a summary. It may not cover all possible information. If you have questions about this medicine, talk to your doctor, pharmacist, or health care provider.  2020 Elsevier/Gold Standard (2018-06-18 13:43:11)    Ondansetron oral dissolving tablet What is this medicine? ONDANSETRON (on DAN se tron) is used to treat nausea and vomiting caused by chemotherapy. It is also used to prevent or treat nausea and vomiting after surgery. This medicine may be used for other purposes; ask your health care provider or pharmacist if you have  questions. COMMON BRAND NAME(S): Zofran ODT What should I tell my health care provider before I take this medicine? They need to know if you have any of these conditions:  heart disease  history of irregular heartbeat  liver disease  low levels of magnesium or potassium in the blood  an unusual  or allergic reaction to ondansetron, granisetron, other medicines, foods, dyes, or preservatives  pregnant or trying to get pregnant  breast-feeding How should I use this medicine? These tablets are made to dissolve in the mouth. Do not try to push the tablet through the foil backing. With dry hands, peel away the foil backing and gently remove the tablet. Place the tablet in the mouth and allow it to dissolve, then swallow. While you may take these tablets with water, it is not necessary to do so. Talk to your pediatrician regarding the use of this medicine in children. Special care may be needed. Overdosage: If you think you have taken too much of this medicine contact a poison control center or emergency room at once. NOTE: This medicine is only for you. Do not share this medicine with others. What if I miss a dose? If you miss a dose, take it as soon as you can. If it is almost time for your next dose, take only that dose. Do not take double or extra doses. What may interact with this medicine? Do not take this medicine with any of the following medications:  apomorphine  certain medicines for fungal infections like fluconazole, itraconazole, ketoconazole, posaconazole, voriconazole  cisapride  dronedarone  pimozide  thioridazine This medicine may also interact with the following medications:  carbamazepine  certain medicines for depression, anxiety, or psychotic disturbances  fentanyl  linezolid  MAOIs like Carbex, Eldepryl, Marplan, Nardil, and Parnate  methylene blue (injected into a vein)  other medicines that prolong the QT interval (cause an abnormal heart rhythm)  like dofetilide, ziprasidone  phenytoin  rifampicin  tramadol This list may not describe all possible interactions. Give your health care provider a list of all the medicines, herbs, non-prescription drugs, or dietary supplements you use. Also tell them if you smoke, drink alcohol, or use illegal drugs. Some items may interact with your medicine. What should I watch for while using this medicine? Check with your doctor or health care professional as soon as you can if you have any sign of an allergic reaction. What side effects may I notice from receiving this medicine? Side effects that you should report to your doctor or health care professional as soon as possible:  allergic reactions like skin rash, itching or hives, swelling of the face, lips, or tongue  breathing problems  confusion  dizziness  fast or irregular heartbeat  feeling faint or lightheaded, falls  fever and chills  loss of balance or coordination  seizures  sweating  swelling of the hands and feet  tightness in the chest  tremors  unusually weak or tired Side effects that usually do not require medical attention (report to your doctor or health care professional if they continue or are bothersome):  constipation or diarrhea  headache This list may not describe all possible side effects. Call your doctor for medical advice about side effects. You may report side effects to FDA at 1-800-FDA-1088. Where should I keep my medicine? Keep out of the reach of children. Store between 2 and 30 degrees C (36 and 86 degrees F). Throw away any unused medicine after the expiration date. NOTE: This sheet is a summary. It may not cover all possible information. If you have questions about this medicine, talk to your doctor, pharmacist, or health care provider.  2020 Elsevier/Gold Standard (2018-01-19 07:14:10)   Aspirin, ASA oral tablets What is this medicine? ASPIRIN (AS pir in) is a pain reliever. It is  used to  treat mild pain and fever. This medicine is also used as directed by a doctor to prevent and to treat heart attacks, to prevent strokes and blood clots, and to treat arthritis or inflammation. This medicine may be used for other purposes; ask your health care provider or pharmacist if you have questions. COMMON BRAND NAME(S): Aspir-Low, Aspir-Trin, Aspirtab, Bayer Advanced Aspirin, Bayer Aspirin, Bayer Aspirin Extra Strength, Bayer Aspirin Plus, Surveyor, quantity, Surveyor, quantity Plus, Bayer Genuine Aspirin, Bayer Womens Aspirin, Bufferin, Bufferin Extra Strength, Bufferin Low Dose What should I tell my health care provider before I take this medicine? They need to know if you have any of these conditions:  anemia  asthma  bleeding problems  child with chickenpox, the flu, or other viral infection  diabetes  gout  if you frequently drink alcohol containing drinks  kidney disease  liver disease  low level of vitamin K  lupus  smoke tobacco  stomach ulcers or other problems  an unusual or allergic reaction to aspirin, tartrazine dye, other medicines, dyes, or preservatives  pregnant or trying to get pregnant  breast-feeding How should I use this medicine? Take this medicine by mouth with a glass of water. Follow the directions on the package or prescription label. You can take this medicine with or without food. If it upsets your stomach, take it with food. Do not take your medicine more often than directed. Talk to your pediatrician regarding the use of this medicine in children. While this drug may be prescribed for children as young as 7 years of age for selected conditions, precautions do apply. Children and teenagers should not use this medicine to treat chicken pox or flu symptoms unless directed by a doctor. Patients over 22 years old may have a stronger reaction and need a smaller dose. Overdosage: If you think you have taken too much of this medicine  contact a poison control center or emergency room at once. NOTE: This medicine is only for you. Do not share this medicine with others. What if I miss a dose? If you are taking this medicine on a regular schedule and miss a dose, take it as soon as you can. If it is almost time for your next dose, take only that dose. Do not take double or extra doses. What may interact with this medicine? Do not take this medicine with any of the following medications:  cidofovir  ketorolac  probenecid This medicine may also interact with the following medications:  alcohol  alendronate  bismuth subsalicylate  flavocoxid  herbal supplements like feverfew, garlic, ginger, ginkgo biloba, horse chestnut  medicines for diabetes or glaucoma like acetazolamide, methazolamide  medicines for gout  medicines that treat or prevent blood clots like enoxaparin, heparin, ticlopidine, warfarin  other aspirin and aspirin-like medicines  NSAIDs, medicines for pain and inflammation, like ibuprofen or naproxen  pemetrexed  sulfinpyrazone  varicella live vaccine This list may not describe all possible interactions. Give your health care provider a list of all the medicines, herbs, non-prescription drugs, or dietary supplements you use. Also tell them if you smoke, drink alcohol, or use illegal drugs. Some items may interact with your medicine. What should I watch for while using this medicine? If you are treating yourself for pain, tell your doctor or health care professional if the pain lasts more than 10 days, if it gets worse, or if there is a new or different kind of pain. Tell your doctor if you see redness or swelling. Also,  check with your doctor if you have a fever that lasts for more than 3 days. Only take this medicine to prevent heart attacks or blood clotting if prescribed by your doctor or health care professional. Do not take aspirin or aspirin-like medicines with this medicine. Too much aspirin  can be dangerous. Always read the labels carefully. This medicine can irritate your stomach or cause bleeding problems. Do not smoke cigarettes or drink alcohol while taking this medicine. Do not lie down for 30 minutes after taking this medicine to prevent irritation to your throat. If you are scheduled for any medical or dental procedure, tell your healthcare provider that you are taking this medicine. You may need to stop taking this medicine before the procedure. This medicine may be used to treat migraines. If you take migraine medicines for 10 or more days a month, your migraines may get worse. Keep a diary of headache days and medicine use. Contact your healthcare professional if your migraine attacks occur more frequently. What side effects may I notice from receiving this medicine? Side effects that you should report to your doctor or health care professional as soon as possible:  allergic reactions like skin rash, itching or hives, swelling of the face, lips, or tongue  breathing problems  changes in hearing, ringing in the ears  confusion  general ill feeling or flu-like symptoms  pain on swallowing  redness, blistering, peeling or loosening of the skin, including inside the mouth or nose  signs and symptoms of bleeding such as bloody or black, tarry stools; red or dark-brown urine; spitting up blood or brown material that looks like coffee grounds; red spots on the skin; unusual bruising or bleeding from the eye, gums, or nose  trouble passing urine or change in the amount of urine  unusually weak or tired  yellowing of the eyes or skin Side effects that usually do not require medical attention (report to your doctor or health care professional if they continue or are bothersome):  diarrhea or constipation  headache  nausea, vomiting  stomach gas, heartburn This list may not describe all possible side effects. Call your doctor for medical advice about side effects.  You may report side effects to FDA at 1-800-FDA-1088. Where should I keep my medicine? Keep out of the reach of children. Store at room temperature between 15 and 30 degrees C (59 and 86 degrees F). Protect from heat and moisture. Do not use this medicine if it has a strong vinegar smell. Throw away any unused medicine after the expiration date. NOTE: This sheet is a summary. It may not cover all possible information. If you have questions about this medicine, talk to your doctor, pharmacist, or health care provider.  2020 Elsevier/Gold Standard (2016-03-11 10:42:13)

## 2020-02-09 NOTE — Anesthesia Procedure Notes (Signed)
Anesthesia Regional Block: Interscalene brachial plexus block   Pre-Anesthetic Checklist: ,, timeout performed, Correct Patient, Correct Site, Correct Laterality, Correct Procedure, Correct Position, site marked, Risks and benefits discussed,  Surgical consent,  Pre-op evaluation,  At surgeon's request and post-op pain management  Laterality: Left  Prep: chloraprep       Needles:  Injection technique: Single-shot  Needle Type: Stimiplex     Needle Length: 5cm  Needle Gauge: 22     Additional Needles:   Procedures:, nerve stimulator,,, ultrasound used (permanent image in chart),,,,  Narrative:  Start time: 02/09/2020 10:08 AM End time: 02/09/2020 10:16 AM  Performed by: With CRNAs  Anesthesiologist: Windell Norfolk, MD CRNA: Brynda Peon, CRNA

## 2020-02-09 NOTE — Brief Op Note (Signed)
02/09/2020  1:52 PM  PATIENT:  Heather Richard  39 y.o. female  PRE-OPERATIVE DIAGNOSIS:  Left 2nd metacarpal fracture; Left distal radius fracture  POST-OPERATIVE DIAGNOSIS:  Left 2nd metacarpal fracture; Left distal radius fracture  PROCEDURE:  Procedure(s) with comments: OPEN REDUCTION INTERNAL FIXATION (ORIF) METACARPAL (Left) - Left 2nd metacarpal fracture; possible pinning OPEN REDUCTION INTERNAL FIXATION (ORIF) DISTAL RADIAL FRACTURE (Left)  SURGEON:  Surgeon(s) and Role:    Oliver Barre, MD - Primary  PHYSICIAN ASSISTANT:   ASSISTANTS: Cecile Sheerer    ANESTHESIA:   regional and general  EBL:  50 mL   BLOOD ADMINISTERED:none  DRAINS: none   LOCAL MEDICATIONS USED:  NONE  SPECIMEN:  No Specimen  DISPOSITION OF SPECIMEN:  N/A  COUNTS:  YES  TOURNIQUET:   Total Tourniquet Time Documented: Upper Arm (Left) - 128 minutes Total: Upper Arm (Left) - 128 minutes   DICTATION: .Note written in EPIC  PLAN OF CARE: Discharge to home after PACU  PATIENT DISPOSITION:  PACU - hemodynamically stable.   Delay start of Pharmacological VTE agent (>24hrs) due to surgical blood loss or risk of bleeding: no

## 2020-02-09 NOTE — Anesthesia Preprocedure Evaluation (Signed)
Anesthesia Evaluation  Patient identified by MRN, date of birth, ID band Patient awake    Reviewed: Allergy & Precautions, H&P , NPO status , Patient's Chart, lab work & pertinent test results, reviewed documented beta blocker date and time   Airway Mallampati: II  TM Distance: >3 FB Neck ROM: full    Dental no notable dental hx. (+) Teeth Intact   Pulmonary neg pulmonary ROS, Current Smoker,    Pulmonary exam normal breath sounds clear to auscultation       Cardiovascular Exercise Tolerance: Good negative cardio ROS   Rhythm:regular Rate:Normal     Neuro/Psych negative neurological ROS  negative psych ROS   GI/Hepatic negative GI ROS, Neg liver ROS,   Endo/Other  negative endocrine ROSdiabetes, Poorly Controlled, Type 2  Renal/GU negative Renal ROS  negative genitourinary   Musculoskeletal   Abdominal   Peds  Hematology negative hematology ROS (+)   Anesthesia Other Findings   Reproductive/Obstetrics negative OB ROS                             Anesthesia Physical Anesthesia Plan  ASA: III  Anesthesia Plan: General   Post-op Pain Management:  Regional for Post-op pain and GA combined w/ Regional for post-op pain   Induction:   PONV Risk Score and Plan: Ondansetron  Airway Management Planned:   Additional Equipment:   Intra-op Plan:   Post-operative Plan:   Informed Consent: I have reviewed the patients History and Physical, chart, labs and discussed the procedure including the risks, benefits and alternatives for the proposed anesthesia with the patient or authorized representative who has indicated his/her understanding and acceptance.     Dental Advisory Given  Plan Discussed with: CRNA  Anesthesia Plan Comments:         Anesthesia Quick Evaluation

## 2020-02-09 NOTE — Telephone Encounter (Signed)
I spoke with the pharmacist Kathlene November and he wants to make sure it was ok that Dr. Dallas Schimke meant to sent in the pain medicine. There is a note that he sent this from her surgery. No other concerns.

## 2020-02-09 NOTE — Interval H&P Note (Signed)
History and Physical Interval Note:  02/09/2020 10:41 AM  Heather Richard  has presented today for surgery, with the diagnosis of Left 2nd metacarpal fracture; Left distal radius fracture.  The various methods of treatment have been discussed with the patient and family. After consideration of risks, benefits and other options for treatment, the patient has consented to  Procedure(s) with comments: OPEN REDUCTION INTERNAL FIXATION (ORIF) METACARPAL (Left) - Left 2nd metacarpal fracture; possible pinning OPEN REDUCTION INTERNAL FIXATION (ORIF) DISTAL RADIAL FRACTURE (Left) as a surgical intervention.  The patient's history has been reviewed, patient examined, no change in status, stable for surgery.  I have reviewed the patient's chart and labs.  Questions were answered to the patient's satisfaction.    Will evaluate left hand and wrist.  Plan for operative fixation of left 2nd metacarpal fracture and possibly the distal radius.  This was discussed with the patient and we will make a determination in the OR  [employer]

## 2020-02-09 NOTE — OR Nursing (Signed)
Cast removed in preop room number 5 by Dr. Dallas Schimke. Equipment cleaned after use.

## 2020-02-09 NOTE — Telephone Encounter (Signed)
Call received from pharmacist at [facility] with question - on line if available [phone])

## 2020-02-09 NOTE — Progress Notes (Signed)
Inpatient Diabetes Program Recommendations  AACE/ADA: New Consensus Statement on Inpatient Glycemic Control (2015)  Target Ranges:  Prepandial:   less than 140 mg/dL      Peak postprandial:   less than 180 mg/dL (1-2 hours)      Critically ill patients:  140 - 180 mg/dL   Lab Results  Component Value Date   GLUCAP 268 (H) 02/09/2020   HGBA1C 11.1 (H) 02/07/2020    Review of Glycemic Control  Inpatient Diabetes Program Recommendations:   A1c 11.1. Will need followup post discharge.  Secure text sent to Dr. Dallas Schimke.  Thank you, Billy Fischer. Lorissa Kishbaugh, RN, MSN, CDE  Diabetes Coordinator Inpatient Glycemic Control Team Team Pager [phone] (8am-5pm) 02/09/2020 11:54 AM

## 2020-02-09 NOTE — Transfer of Care (Signed)
Immediate Anesthesia Transfer of Care Note  Patient: Heather Richard  Procedure(s) Performed: OPEN REDUCTION INTERNAL FIXATION (ORIF) METACARPAL (Left Finger) OPEN REDUCTION INTERNAL FIXATION (ORIF) DISTAL RADIAL FRACTURE (Left )  Patient Location: PACU  Anesthesia Type:General and Regional  Level of Consciousness: awake, alert , oriented and patient cooperative  Airway & Oxygen Therapy: Patient Spontanous Breathing  Post-op Assessment: Report given to RN, Post -op Vital signs reviewed and stable and Patient moving all extremities  Post vital signs: Reviewed and stable  Last Vitals:  Vitals Value Taken Time  BP    Temp    Pulse 81 02/09/20 1349  Resp 12 02/09/20 1349  SpO2 98 % 02/09/20 1349  Vitals shown include unvalidated device data.  Last Pain:  Vitals:   02/09/20 0837  TempSrc: Oral  PainSc: 4       Patients Stated Pain Goal: 6 (02/09/20 1537)  Complications: No complications documented.

## 2020-02-09 NOTE — Anesthesia Procedure Notes (Signed)
Procedure Name: LMA Insertion Performed by: Orrie Schubert L, CRNA Pre-anesthesia Checklist: Patient identified, Emergency Drugs available, Suction available, Patient being monitored and Timeout performed Patient Re-evaluated:Patient Re-evaluated prior to induction Oxygen Delivery Method: Circle system utilized Preoxygenation: Pre-oxygenation with 100% oxygen Induction Type: IV induction LMA: LMA inserted LMA Size: 4.0 Number of attempts: 1 Placement Confirmation: positive ETCO2,  CO2 detector and breath sounds checked- equal and bilateral Tube secured with: Tape Dental Injury: Teeth and Oropharynx as per pre-operative assessment        

## 2020-02-09 NOTE — Progress Notes (Signed)
In talking with pt earlier discovered she is allergic to gabapentin with facial swelling and shortness of breath requiring benedryl. Notified MD thru OR nurse. States pt should not take the gabapentin and pt is aware of this and states will not take. Pts pharmacy is also aware.
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
## 2020-02-15 ENCOUNTER — Encounter (HOSPITAL_COMMUNITY): Payer: Self-pay | Admitting: Orthopedic Surgery

## 2020-02-16 NOTE — Telephone Encounter (Signed)
Received a fax from [facility] that surgery was approved. No other concerns. Forms sent to scan.

## 2020-02-20 ENCOUNTER — Other Ambulatory Visit: Payer: Self-pay | Admitting: Orthopedic Surgery

## 2020-02-21 ENCOUNTER — Other Ambulatory Visit: Payer: Self-pay | Admitting: Orthopedic Surgery

## 2020-02-21 ENCOUNTER — Ambulatory Visit: Payer: Medicaid Other

## 2020-02-21 ENCOUNTER — Telehealth: Payer: Self-pay | Admitting: Orthopedic Surgery

## 2020-02-21 ENCOUNTER — Encounter: Payer: Self-pay | Admitting: Orthopedic Surgery

## 2020-02-21 ENCOUNTER — Ambulatory Visit (INDEPENDENT_AMBULATORY_CARE_PROVIDER_SITE_OTHER): Payer: Medicaid Other | Admitting: Orthopedic Surgery

## 2020-02-21 ENCOUNTER — Other Ambulatory Visit: Payer: Self-pay

## 2020-02-21 VITALS — BP 171/101 | HR 92 | Ht 62.5 in | Wt 150.0 lb

## 2020-02-21 DIAGNOSIS — S62399D Other fracture of unspecified metacarpal bone, subsequent encounter for fracture with routine healing: Secondary | ICD-10-CM | POA: Diagnosis not present

## 2020-02-21 DIAGNOSIS — M25532 Pain in left wrist: Secondary | ICD-10-CM

## 2020-02-21 DIAGNOSIS — M79642 Pain in left hand: Secondary | ICD-10-CM

## 2020-02-21 DIAGNOSIS — M25522 Pain in left elbow: Secondary | ICD-10-CM | POA: Diagnosis not present

## 2020-02-21 DIAGNOSIS — S62321D Displaced fracture of shaft of second metacarpal bone, left hand, subsequent encounter for fracture with routine healing: Secondary | ICD-10-CM | POA: Diagnosis not present

## 2020-02-21 DIAGNOSIS — S52552D Other extraarticular fracture of lower end of left radius, subsequent encounter for closed fracture with routine healing: Secondary | ICD-10-CM | POA: Diagnosis not present

## 2020-02-21 MED ORDER — OXYCODONE HCL 5 MG PO TABS: 5.0000 mg | ORAL_TABLET | ORAL | 0 refills | Status: AC | PRN

## 2020-02-21 NOTE — Telephone Encounter (Signed)
Patient is not going back to pain management and wants to know if you would send something for pain.

## 2020-02-21 NOTE — Patient Instructions (Signed)
General Cast Instructions  1.  You were placed in a cast in clinic today.  Please keep the cast material clean, dry and intact.  Please do not use anything to itch the under the cast.  If it gets itchy, you can consider taking benadryl, or similar medication.  If the cast material gets wet, place it on a towel and use a hair dryer on a low setting. 2.  Tylenol or Ibuprofen/Naproxen as needed.   3.  Recommend elevating your extremity as much as possible to help with swelling. 4.  F/u 2-3 weeks, cast off and repeat XR  
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
## 2020-02-22 NOTE — Addendum Note (Signed)
Addended by: Baird Kay on: 02/22/2020 04:13 PM   Modules accepted: Orders

## 2020-03-07 ENCOUNTER — Ambulatory Visit: Payer: Medicaid Other

## 2020-03-07 ENCOUNTER — Other Ambulatory Visit: Payer: Self-pay

## 2020-03-07 ENCOUNTER — Ambulatory Visit (INDEPENDENT_AMBULATORY_CARE_PROVIDER_SITE_OTHER): Payer: Medicaid Other | Admitting: Orthopedic Surgery

## 2020-03-07 ENCOUNTER — Encounter: Payer: Self-pay | Admitting: Orthopedic Surgery

## 2020-03-07 VITALS — BP 124/94 | HR 103 | Ht 62.5 in

## 2020-03-07 DIAGNOSIS — S52552D Other extraarticular fracture of lower end of left radius, subsequent encounter for closed fracture with routine healing: Secondary | ICD-10-CM | POA: Diagnosis not present

## 2020-03-07 DIAGNOSIS — S62321D Displaced fracture of shaft of second metacarpal bone, left hand, subsequent encounter for fracture with routine healing: Secondary | ICD-10-CM

## 2020-03-07 NOTE — Progress Notes (Signed)
Orthopaedic Postop Note  Assessment: Heather Richard is a 40 y.o. female s/p L wrist ORIF and 2nd metacarpal ORIF  DOS: 02/09/20  Plan: Overall doing well.  Pain is improved; no longer taking pain medication.  Some numbness in superficial radial nerve distribution, and some tingling to volar thumb, otherwise the vague numbness she had previously has improved.  Cast removed in clinic.  She was fitted for a removable wrist brace.  She should continue to wear this when not working with therapy until the next visit.  She has a referral in place to start working with hand therapy, but this could not start until the cast was removed.  Follow up in 4 weeks.    Follow-up: Return in about 4 weeks (around 04/04/2020).    XR at next visit: Left wrist/hand  Subjective:  Chief Complaint  Patient presents with  . Wrist Pain    Left     History of Present Illness: Heather Richard is a 40 y.o. LHD female who presents following the above stated procedure.  She continues to do well.  She has tolerated the cast well.  Her pain has improved.  She is no longer taking narcotics, only ibuprofen occasionally.   She was unable to start hand therapy because of the cast.  She has been doing some finger exercises in the cast.  The numbness she was experiencing at the last visit is improving.  She has some tingling on the volar thumb with some numbness over the dorsal thumb.     Review of Systems: No fevers or chills + numbness or tingling; left forearm and hand No Chest Pain No shortness of breath   Objective: BP (!) 124/94   Pulse (!) 103   Ht 5' 2.5" (1.588 m)   LMP 03/07/2020   BMI 27.00 kg/m   Physical Exam:  Evaluation of the left hand and wrist demonstrates surgical incisions healing well.  No surrounding erythema or drainage.  Minimal swelling.  Range of motion to the index finger is significantly improving.  Tolerates gentle flexion and extension at her wrist.  Limited supination.  Fingers are  warm and well perfused.   IMAGING: I personally ordered and reviewed the following images:  X-ray the left hand demonstrates healing second metacarpal fracture.  There is been no interval displacement of the fracture.  Hardware remains in good position.  Impression: Healing left second metacarpal fracture status post ORIF  X-ray of the left wrist demonstrates healing distal radius fracture.  Hardware remains in good position.  No evidence of hardware failure or loosening.  There is been no interval displacement at the fracture site.  Impression: Healing distal radius fracture status post ORIF   Oliver Barre, MD 03/07/2020 11:09 AM

## 2020-04-04 ENCOUNTER — Ambulatory Visit: Payer: Medicaid Other | Admitting: Orthopedic Surgery

## 2020-04-04 DIAGNOSIS — S52552D Other extraarticular fracture of lower end of left radius, subsequent encounter for closed fracture with routine healing: Secondary | ICD-10-CM

## 2020-04-09 ENCOUNTER — Telehealth: Payer: Self-pay | Admitting: Orthopedic Surgery
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
## 2020-04-16 DIAGNOSIS — R079 Chest pain, unspecified: Secondary | ICD-10-CM | POA: Insufficient documentation

## 2020-04-17 ENCOUNTER — Telehealth: Payer: Self-pay

## 2020-04-17 NOTE — Telephone Encounter (Signed)
NOTES ON FILE FROM  [facility] [phone], SENT REFERRAL TO SCHEDULING

## 2020-05-18 ENCOUNTER — Ambulatory Visit: Payer: Medicaid Other | Admitting: Cardiovascular Disease

## 2020-05-18 NOTE — Progress Notes (Deleted)
NO SHOW

## 2020-05-21 ENCOUNTER — Encounter: Payer: Self-pay | Admitting: Cardiovascular Disease

## 2020-07-04 ENCOUNTER — Other Ambulatory Visit (HOSPITAL_COMMUNITY): Payer: Self-pay | Admitting: Medical

## 2020-07-04 DIAGNOSIS — F1190 Opioid use, unspecified, uncomplicated: Secondary | ICD-10-CM

## 2020-07-04 NOTE — Progress Notes (Signed)
POC UDS

## 2020-07-06 ENCOUNTER — Ambulatory Visit: Payer: Medicaid Other

## 2020-07-06 ENCOUNTER — Other Ambulatory Visit: Payer: Self-pay

## 2020-07-06 ENCOUNTER — Encounter: Payer: Self-pay | Admitting: Orthopedic Surgery

## 2020-07-06 ENCOUNTER — Ambulatory Visit (INDEPENDENT_AMBULATORY_CARE_PROVIDER_SITE_OTHER): Payer: Medicaid Other | Admitting: Orthopedic Surgery

## 2020-07-06 DIAGNOSIS — S62399D Other fracture of unspecified metacarpal bone, subsequent encounter for fracture with routine healing: Secondary | ICD-10-CM | POA: Diagnosis not present

## 2020-07-06 DIAGNOSIS — S52552D Other extraarticular fracture of lower end of left radius, subsequent encounter for closed fracture with routine healing: Secondary | ICD-10-CM | POA: Diagnosis not present

## 2020-07-06 NOTE — Patient Instructions (Signed)
Rotator Cuff Tear/Tendinitis Rehab   Ask your health care provider which exercises are safe for you. Do exercises exactly as told by your health care provider and adjust them as directed. It is normal to feel mild stretching, pulling, tightness, or discomfort as you do these exercises. Stop right away if you feel sudden pain or your pain gets worse. Do not begin these exercises until told by your health care provider. Stretching and range-of-motion exercises  These exercises warm up your muscles and joints and improve the movement and flexibility of your shoulder. These exercises also help to relieve pain.  Shoulder pendulum In this exercise, you let the injured arm dangle toward the floor and then swing it like a clock pendulum. 1. Stand near a table or counter that you can hold onto for balance. 2. Bend forward at the waist and let your left / right arm hang straight down. Use your other arm to support you and help you stay balanced. 3. Relax your left / right arm and shoulder muscles, and move your hips and your trunk so your left / right arm swings freely. Your arm should swing because of the motion of your body, not because you are using your arm or shoulder muscles. 4. Keep moving your hips and trunk so your arm swings in the following directions, as told by your health care provider: ? Side to side. ? Forward and backward. ? In clockwise and counterclockwise circles. 5. Slowly return to the starting position. Repeat 10 times, or for 10 seconds per direction. Complete this exercise 2-3 times a day.      Shoulder flexion, seated This exercise is sometimes called table slides. In this exercise, you raise your arm in front of your body until you feel a stretch in your injured shoulder. 1. Sit in a stable chair so your left / right forearm can rest on a flat surface. Your elbow should rest at a height that keeps your upper arm next to your body. 2. Keeping your left / right shoulder relaxed,  lean forward at the waist and let your hand slide forward (flexion). Stop when you feel a stretch in your shoulder, or when you reach the angle that is recommended by your health care provider. 3. Hold for 5 seconds. 4. Slowly return to the starting position. Repeat 10 times. Complete this exercise 1-2  times a day.       Shoulder flexion, standing In this exercise, you raise your arm in front of your body (flexion) until you feel a stretch in your injured shoulder. 1. Stand and hold a broomstick, a cane, or a similar object. Place your hands a little more than shoulder-width apart on the object. Your left / right hand should be palm-up, and your other hand should be palm-down. 2. Keep your elbow straight and your shoulder muscles relaxed. Push the stick up with your healthy arm to raise your left / right arm in front of your body, and then over your head until you feel a stretch in your shoulder. ? Avoid shrugging your shoulder while you raise your arm. Keep your shoulder blade tucked down toward the middle of your back. ? Keep your left / right shoulder muscles relaxed. 3. Hold for 10 seconds. 4. Slowly return to the starting position. Repeat 10 times. Complete this exercise 1-2 times a day.      Shoulder abduction, active-assisted You will need a stick, broom handle, or similar object to help you (assist) in doing this   exercise. 1. Lie on your back. This is the supine position. Hold a broomstick, a cane, or a similar object. 2. Place your hands a little more than shoulder-width apart on the object. Your left / right hand should be palm-up, and your other hand should be palm-down. 3. Keeping your shoulder relaxed, push the stick to raise your left / right arm out to your side (abduction) and then over your head. Use your other hand to help move the stick. Stop when you feel a stretch in your shoulder, or when you reach the angle that is recommended by your health care provider. ? Avoid  shrugging your shoulder while you raise your arm. Keep your shoulder blade tucked down toward the middle of your back. 4. Hold for 10 seconds. 5. Slowly return to the starting position. Repeat 10 times. Complete this exercise 1-2 times a day.      Shoulder flexion, active-assisted 1. Lie on your back. You may bend your knees for comfort. 2. Hold a broomstick, a cane, or a similar object so that your hands are about shoulder-width apart. Your palms should face toward your feet. 3. Raise your left / right arm over your head, then behind your head toward the floor (flexion). Use your other hand to help you do this (active-assisted). Stop when you feel a gentle stretch in your shoulder, or when you reach the angle that is recommended by your health care provider. 4. Hold for 10 seconds. 5. Use the stick and your other arm to help you return your left / right arm to the starting position. Repeat 10 times. Complete this exercise 1-2 times a day.      External rotation 1. Sit in a stable chair without armrests, or stand up. 2. Tuck a soft object, such as a folded towel or a small ball, under your left / right upper arm. 3. Hold a broomstick, a cane, or a similar object with your palms face-down, toward the floor. Bend your elbows to a 90-degree angle (right angle), and keep your hands about shoulder-width apart. 4. Straighten your healthy arm and push the stick across your body, toward your left / right side. Keep your left / right arm bent. This will rotate your left / right forearm away from your body (external rotation). 5. Hold for 10 seconds. 6. Slowly return to the starting position. Repeat 10 times. Complete this exercise 1-2 times a day.        Strengthening exercises These exercises build strength and endurance in your shoulder. Endurance is the ability to use your muscles for a long time, even after they get tired. Do not start doing these exercises until your health care provider  approves. Shoulder flexion, isometric 1. Stand or sit in a doorway, facing the door frame. 2. Keep your left / right arm straight and make a gentle fist with your hand. Place your fist against the door frame. Only your fist should be touching the frame. Keep your upper arm at your side. 3. Gently press your fist against the door frame, as if you are trying to raise your arm above your head (isometric shoulder flexion). ? Avoid shrugging your shoulder while you press your hand into the door frame. Keep your shoulder blade tucked down toward the middle of your back. 4. Hold for 10 seconds. 5. Slowly release the tension, and relax your muscles completely before you repeat the exercise. Repeat 10 times. Complete this exercise 3 times per week.        Shoulder abduction, isometric 1. Stand or sit in a doorway. Your left / right arm should be closest to the door frame. 2. Keep your left / right arm straight, and place the back of your hand against the door frame. Only your hand should be touching the frame. Keep the rest of your arm close to your side. 3. Gently press the back of your hand against the door frame, as if you are trying to raise your arm out to the side (isometric shoulder abduction). ? Avoid shrugging your shoulder while you press your hand into the door frame. Keep your shoulder blade tucked down toward the middle of your back. 4. Hold for 10 seconds. 5. Slowly release the tension, and relax your muscles completely before you repeat the exercise. Repeat 10 times. Complete this exercise 3 times per week.      Internal rotation, isometric This is an exercise in which you press your palm against a door frame without moving your shoulder joint (isometric). 1. Stand or sit in a doorway, facing the door frame. 2. Bend your left / right elbow, and place the palm of your hand against the door frame. Only your palm should be touching the frame. Keep your upper arm at your side. 3. Gently press  your hand against the door frame, as if you are trying to push your arm toward your abdomen (internal rotation). Gradually increase the pressure until you are pressing as hard as you can. Stop increasing the pressure if you feel shoulder pain. ? Avoid shrugging your shoulder while you press your hand into the door frame. Keep your shoulder blade tucked down toward the middle of your back. 4. Hold for 10 seconds. 5. Slowly release the tension, and relax your muscles completely before you repeat the exercise. Repeat 10 times. Complete this exercise 3 times per week.      External rotation, isometric This is an exercise in which you press the back of your wrist against a door frame without moving your shoulder joint (isometric). 1. Stand or sit in a doorway, facing the door frame. 2. Bend your left / right elbow and place the back of your wrist against the door frame. Only the back of your wrist should be touching the frame. Keep your upper arm at your side. 3. Gently press your wrist against the door frame, as if you are trying to push your arm away from your abdomen (external rotation). Gradually increase the pressure until you are pressing as hard as you can. Stop increasing the pressure if you feel pain. ? Avoid shrugging your shoulder while you press your wrist into the door frame. Keep your shoulder blade tucked down toward the middle of your back. 4. Hold for 10 seconds. 5. Slowly release the tension, and relax your muscles completely before you repeat the exercise. Repeat 10 times. Complete this exercise 3 times per week.       Scapular retraction 1. Sit in a stable chair without armrests, or stand up. 2. Secure an exercise band to a stable object in front of you so the band is at shoulder height. 3. Hold one end of the exercise band in each hand. Your palms should face down. 4. Squeeze your shoulder blades together (retraction) and move your elbows slightly behind you. Do not shrug your  shoulders upward while you do this. 5. Hold for 10 seconds. 6. Slowly return to the starting position. Repeat 10 times. Complete this exercise 3 times per week.        Shoulder extension 1. Sit in a stable chair without armrests, or stand up. 2. Secure an exercise band to a stable object in front of you so the band is above shoulder height. 3. Hold one end of the exercise band in each hand. 4. Straighten your elbows and lift your hands up to shoulder height. 5. Squeeze your shoulder blades together and pull your hands down to the sides of your thighs (extension). Stop when your hands are straight down by your sides. Do not let your hands go behind your body. 6. Hold for 10 seconds. 7. Slowly return to the starting position. Repeat 10 times. Complete this exercise 3 times per week.       Scapular protraction, supine 1. Lie on your back on a firm surface (supine position). Hold a 5 lbs (or soup can) weight in your left / right hand. 2. Raise your left / right arm straight into the air so your hand is directly above your shoulder joint. 3. Push the weight into the air so your shoulder (scapula) lifts off the surface that you are lying on. The scapula will push up or forward (protraction). Do not move your head, neck, or back. 4. Hold for 10 seconds. 5. Slowly return to the starting position. Let your muscles relax completely before you repeat this exercise.  Repeat 10 times. Complete this exercise 3 times per week.          

## 2020-07-06 NOTE — Progress Notes (Signed)
Orthopaedic Postop Note  Assessment: Heather Richard is a 40 y.o. female s/p L wrist ORIF and 2nd metacarpal ORIF  DOS: 02/09/20  Plan: She is recovered well following surgery.  She still has some residual stiffness in the index finger to the left hand.  She has been doing exercises on her own.  We discussed some specific exercises for her to continue working on.  I have also advised her that she can see continued improvements for up to a year after surgery.  I have encouraged her to continue working, in order to regain all of the motion in the index finger.  She has excellent range of motion of the left wrist.  In regards to some of the sensitivity around the surgical incisions, I have recommended that she continue to gently massage the incisions, in order to desensitize these areas.  Continue with medications as needed.  In regards to her right shoulder, I have provided her with some home exercises for her to initiate.  If she continues to have difficulty, we can try formal physical therapy or proceed with a steroid injection.  All questions were answered and she is amenable to this plan.  Follow-up: Return if symptoms worsen or fail to improve.    XR at next visit: Left wrist/hand  Subjective:  Chief Complaint  Patient presents with  . Routine Post Op    DOS 02/09/2020    History of Present Illness: Heather Richard is a 40 y.o. LHD female who presents following the above stated procedure.  She has not been seen in clinic since approximately 1 month postop.  Since then, she has continued to improve.  She has been doing exercises for her left wrist and hand on her own.  Range of motion is significantly improved.  She is no longer having numbness or tingling, that was present prior to surgery.  She does notice some persistent stiffness in the index finger.  In addition, she continues to have pain in her right shoulder.  Overall, her range of motion is significantly decreased.  She is taking NSAIDs  as needed.   Review of Systems: No fevers or chills Numbness or tingling No Chest Pain No shortness of breath   Objective: LMP 06/29/2020 (Exact Date)   Physical Exam:  Evaluation of the left hand and wrist demonstrates surgical incisions healing well.  No surrounding erythema or drainage.  Minimal swelling.   Full range of motion at the wrist.  She is able to make a fist, with some residual stiffness in the index finger.  She is easily able to fully extend the index finger, but has difficulty with some flexion.  Right shoulder is without deformity.  Limited range of motion due to pain.  Forward flexion to 90 degrees.  Abduction to 75 degrees at her side.  IMAGING: I personally ordered and reviewed the following images:  X-rays of the left hand and the wrist were obtained in clinic today.  There has been interval consolidation of the fractures to the second metacarpal, as well as the distal radius.  No hardware failure or subsidence.  None of the screws have backed out.  Impression: Healed distal radius and second metacarpal fractures  Oliver Barre, MD 07/06/2020 1:13 PM

## 2020-07-19 ENCOUNTER — Ambulatory Visit (HOSPITAL_COMMUNITY): Payer: Medicaid Other | Admitting: Medical

## 2020-07-19 ENCOUNTER — Other Ambulatory Visit: Payer: Self-pay

## 2020-09-10 ENCOUNTER — Telehealth: Payer: Self-pay | Admitting: Orthopedic Surgery

## 2020-09-10 ENCOUNTER — Encounter: Payer: Self-pay | Admitting: Orthopedic Surgery

## 2020-09-10 NOTE — Telephone Encounter (Deleted)
Patient question about exercises (shoulder), which she states she had to discontinue, due to hurting when she exercises.  Asking for appointment - I offered next available, 09/25/20, due to Dr Dallas Schimke scheduled to be out office next week. Please advise if ok for 09/25/20, and if any recommendations other than appointment.

## 2020-09-12 NOTE — Telephone Encounter (Signed)
Patient called back; aware of Dr Dallas Schimke' response; appointment scheduled accordingly.

## 2020-09-25 ENCOUNTER — Other Ambulatory Visit: Payer: Self-pay

## 2020-09-25 ENCOUNTER — Encounter: Payer: Self-pay | Admitting: Orthopedic Surgery

## 2020-09-25 ENCOUNTER — Ambulatory Visit (INDEPENDENT_AMBULATORY_CARE_PROVIDER_SITE_OTHER): Payer: Medicaid Other | Admitting: Orthopedic Surgery

## 2020-09-25 VITALS — BP 149/103 | HR 88 | Ht 62.5 in | Wt 149.0 lb

## 2020-09-25 DIAGNOSIS — M5412 Radiculopathy, cervical region: Secondary | ICD-10-CM | POA: Diagnosis not present

## 2020-09-25 DIAGNOSIS — M25511 Pain in right shoulder: Secondary | ICD-10-CM

## 2020-09-25 DIAGNOSIS — G8929 Other chronic pain: Secondary | ICD-10-CM | POA: Diagnosis not present
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
## 2020-10-09 ENCOUNTER — Other Ambulatory Visit: Payer: Self-pay

## 2020-10-09 ENCOUNTER — Ambulatory Visit
Admission: RE | Admit: 2020-10-09 | Discharge: 2020-10-09 | Disposition: A | Discharge status: Home / Self Care | Payer: Medicaid Other | Source: Ambulatory Visit | Attending: Orthopedic Surgery | Admitting: Orthopedic Surgery

## 2020-10-09 DIAGNOSIS — G8929 Other chronic pain: Secondary | ICD-10-CM

## 2020-10-22 ENCOUNTER — Encounter: Payer: Self-pay | Admitting: Orthopedic Surgery

## 2020-10-22 ENCOUNTER — Other Ambulatory Visit: Payer: Self-pay

## 2020-10-22 ENCOUNTER — Ambulatory Visit: Payer: Medicaid Other | Admitting: Orthopedic Surgery

## 2020-10-22 DIAGNOSIS — M542 Cervicalgia: Secondary | ICD-10-CM

## 2020-10-22 DIAGNOSIS — R937 Abnormal findings on diagnostic imaging of other parts of musculoskeletal system: Secondary | ICD-10-CM
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
## 2021-06-13 ENCOUNTER — Telehealth: Payer: Medicaid Other | Admitting: Family Medicine

## 2021-06-13 DIAGNOSIS — N898 Other specified noninflammatory disorders of vagina: Secondary | ICD-10-CM | POA: Diagnosis not present

## 2021-06-13 MED ORDER — FLUCONAZOLE 150 MG PO TABS: 150.0000 mg | ORAL_TABLET | Freq: Once | ORAL | 0 refills | Status: AC

## 2021-06-13 NOTE — Progress Notes (Signed)
?Virtual Visit Consent  ? ?Heather Richard, you are scheduled for a virtual visit with a [facility] provider today. Just as with appointments in the office, your consent must be obtained to participate. Your consent will be active for this visit and any virtual visit you may have with one of our providers in the next 365 days. If you have a MyChart account, a copy of this consent can be sent to you electronically. ? ?As this is a virtual visit, video technology does not allow for your provider to perform a traditional examination. This may limit your provider's ability to fully assess your condition. If your provider identifies any concerns that need to be evaluated in person or the need to arrange testing (such as labs, EKG, etc.), we will make arrangements to do so. Although advances in technology are sophisticated, we cannot ensure that it will always work on either your end or our end. If the connection with a video visit is poor, the visit may have to be switched to a telephone visit. With either a video or telephone visit, we are not always able to ensure that we have a secure connection. ? ?By engaging in this virtual visit, you consent to the provision of healthcare and authorize for your insurance to be billed (if applicable) for the services provided during this visit. Depending on your insurance coverage, you may receive a charge related to this service. ? ?I need to obtain your verbal consent now. Are you willing to proceed with your visit today? Heather Richard has provided verbal consent on 06/13/2021 for a virtual visit (video or telephone). Freddy Finner, NP ? ?Date: 06/13/2021 1:00 PM ? ?Virtual Visit via Video Note  ? ?IFreddy Finner, connected with  Heather Richard  ([id number], 1980-04-23) on 06/13/21 at  1:00 PM EDT by a video-enabled telemedicine application and verified that I am speaking with the correct person using two identifiers. ? ?Location: ?Patient: Virtual Visit Location Patient:  Home ?Provider: Virtual Visit Location Provider: Home Office ?  ?I discussed the limitations of evaluation and management by telemedicine and the availability of in person appointments. The patient expressed understanding and agreed to proceed.   ? ?History of Present Illness: ?Heather Richard is a 41 y.o. who identifies as a female who was assigned female at birth, and is being seen today for yeast infection. ? ?HPI: Vaginal Discharge ?The patient's primary symptoms include genital itching and vaginal discharge. The patient's pertinent negatives include no genital lesions, genital odor, genital rash, missed menses or pelvic pain. This is a new problem. The current episode started in the past 7 days. The problem occurs constantly. The problem has been gradually worsening. Pertinent negatives include no abdominal pain, anorexia, back pain, chills, constipation, diarrhea, discolored urine, dysuria, fever, flank pain, frequency, headaches, hematuria, joint pain, joint swelling, nausea, painful intercourse, rash, sore throat, urgency or vomiting. The vaginal discharge was white and milky. There has been no bleeding. She has not been passing clots. She has not been passing tissue. Nothing (monistat) aggravates the symptoms. The treatment provided no relief. She is not sexually active. She uses nothing for contraception. Her past medical history is significant for vaginosis.   ?Problems:  ?Patient Active Problem List  ? Diagnosis Date Noted  ? Chest pain 04/16/2020  ? Major depression, chronic 10/20/2019  ? Migraine 10/20/2019  ? Opioid use, unspecified with unspecified opioid-induced disorder (HCC) 10/20/2019  ? 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 Type 1 diabetes (HCC) 05/30/2019  ?  ?  Allergies:  ?Allergies  ?Allergen Reactions  ? Gabapentin Swelling  ?  Facila sweling, shortness of breath requiring benedryl.  ? Latex Rash  ?  "it breaks me out"  ? Tomato Anaphylaxis, Nausea And Vomiting and Swelling  ? Metformin Diarrhea   ? Tylenol [Acetaminophen] Hives and Rash  ? ?Medications:  ?Current Outpatient Medications:  ?  ACCU-CHEK GUIDE test strip, USE TO TEST GLUCOSE THREE TIME A DAY, Disp: , Rfl:  ?  ibuprofen (ADVIL) 800 MG tablet, , Disp: , Rfl:  ?  insulin glargine (LANTUS) 100 unit/mL SOPN, Inject 27 Units into the skin at bedtime., Disp: , Rfl:  ?  oxymetazoline (AFRIN) 0.05 % nasal spray, Place 1 spray into both nostrils 2 (two) times daily., Disp: , Rfl:  ?  SUMAtriptan (IMITREX) 50 MG tablet, Take 50 mg by mouth every 2 (two) hours as needed for migraine. May repeat in 2 hours if headache persists or recurs., Disp: , Rfl:  ? ?Observations/Objective: ?Patient is well-developed, well-nourished in no acute distress.  ?Resting comfortably  at home.  ?Head is normocephalic, atraumatic.  ?No labored breathing.  ?Speech is clear and coherent with logical content.  ?Patient is alert and oriented at baseline.  ? ? ?Assessment and Plan: ?1. Vaginal discharge ? ?- fluconazole (DIFLUCAN) 150 MG tablet; Take 1 tablet (150 mg total) by mouth once for 1 dose.  Dispense: 1 tablet; Refill: 0 ? ?Classic presentation of yeast infection will treat as such ?Advised follow up in person if not improved  ? ? Reviewed side effects, risks and benefits of medication.   ? ?Patient acknowledged agreement and understanding of the plan.  ? ? ?Follow Up Instructions: ?I discussed the assessment and treatment plan with the patient. The patient was provided an opportunity to ask questions and all were answered. The patient agreed with the plan and demonstrated an understanding of the instructions.  A copy of instructions were sent to the patient via MyChart unless otherwise noted below.  ? ?The patient was advised to call back or seek an in-person evaluation if the symptoms worsen or if the condition fails to improve as anticipated. ? ?Time:  ?I spent 10 minutes with the patient via telehealth technology discussing the above problems/concerns.   ? ?Freddy Finner, NP ? ?

## 2021-06-13 NOTE — Patient Instructions (Signed)
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
## 2021-09-20 ENCOUNTER — Telehealth: Payer: Self-pay | Admitting: Orthopedic Surgery

## 2021-09-20 NOTE — Telephone Encounter (Signed)
I returned patient's call yesterday 09/19/21 per her voice message relaying she would like to see Dr Dallas Schimke again for appointment for left hand and right shoulder. I relayed we had a training most of the afternoon, and therefore, returned her call to further discuss and schedule. Reached voice mail, left message to call back to schedule appointment.

## 2021-10-07 ENCOUNTER — Ambulatory Visit (INDEPENDENT_AMBULATORY_CARE_PROVIDER_SITE_OTHER): Payer: Medicaid Other

## 2021-10-07 ENCOUNTER — Ambulatory Visit: Payer: Medicaid Other | Admitting: Orthopedic Surgery

## 2021-10-07 ENCOUNTER — Encounter: Payer: Self-pay | Admitting: Orthopedic Surgery

## 2021-10-07 ENCOUNTER — Other Ambulatory Visit: Payer: Self-pay | Admitting: Orthopedic Surgery

## 2021-10-07 DIAGNOSIS — M79642 Pain in left hand: Secondary | ICD-10-CM

## 2021-10-07 DIAGNOSIS — M25511 Pain in right shoulder: Secondary | ICD-10-CM | POA: Diagnosis not present

## 2021-10-07 DIAGNOSIS — G8929 Other chronic pain: Secondary | ICD-10-CM

## 2021-10-07 DIAGNOSIS — M79641 Pain in right hand: Secondary | ICD-10-CM

## 2021-10-07 MED ORDER — METHYLPREDNISOLONE ACETATE 40 MG/ML IJ SUSP
40.0000 mg | Freq: Once | INTRAMUSCULAR | Status: AC
  Administered 2021-10-07: 40 mg via INTRA_ARTICULAR
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
## 2021-10-07 NOTE — Patient Instructions (Addendum)
Instructions Following Joint Injections  In clinic today, you received an injection in one of your joints (sometimes more than one).  Occasionally, you can have some pain at the injection site, this is normal.  You can place ice at the injection site, or take over-the-counter medications such as Tylenol (acetaminophen) or Advil (ibuprofen).  Please follow all directions listed on the bottle.  If your joint (knee or shoulder) becomes swollen, red or very painful, please contact the clinic for additional assistance.   Two medications were injected, including lidocaine and a steroid (often referred to as cortisone).  Lidocaine is effective almost immediately but wears off quickly.  However, the steroid can take a few days to improve your symptoms.  In some cases, it can make your pain worse for a couple of days.  Do not be concerned if this happens as it is common.  You can apply ice or take some over-the-counter medications as needed.    PT for your shoulder  Continue to work on range of motion of your finger.

## 2021-10-31 ENCOUNTER — Ambulatory Visit (HOSPITAL_COMMUNITY): Payer: Medicaid Other | Attending: Orthopedic Surgery | Admitting: Occupational Therapy

## 2021-11-05 ENCOUNTER — Emergency Department (HOSPITAL_COMMUNITY)
Admission: EM | Admit: 2021-11-05 | Discharge: 2021-11-05 | Discharge status: Against Medical Advice | Payer: Medicaid Other

## 2021-11-15 ENCOUNTER — Ambulatory Visit (HOSPITAL_COMMUNITY): Payer: Medicaid Other | Admitting: Occupational Therapy

## 2021-11-15 ENCOUNTER — Emergency Department (HOSPITAL_COMMUNITY)
Admission: EM | Admit: 2021-11-15 | Discharge: 2021-11-15 | Disposition: A | Discharge status: Home / Self Care | Payer: Medicaid Other | Attending: Emergency Medicine | Admitting: Emergency Medicine

## 2021-11-15 ENCOUNTER — Encounter (HOSPITAL_COMMUNITY): Payer: Self-pay

## 2021-11-15 ENCOUNTER — Emergency Department (HOSPITAL_COMMUNITY): Payer: Medicaid Other

## 2021-11-15 ENCOUNTER — Other Ambulatory Visit: Payer: Self-pay

## 2021-11-15 DIAGNOSIS — Z91040 Latex allergy status: Secondary | ICD-10-CM | POA: Diagnosis not present

## 2021-11-15 DIAGNOSIS — R0602 Shortness of breath: Secondary | ICD-10-CM | POA: Diagnosis not present

## 2021-11-15 DIAGNOSIS — R079 Chest pain, unspecified: Secondary | ICD-10-CM | POA: Diagnosis present

## 2021-11-15 DIAGNOSIS — D72829 Elevated white blood cell count, unspecified: Secondary | ICD-10-CM | POA: Diagnosis not present

## 2021-11-15 DIAGNOSIS — E878 Other disorders of electrolyte and fluid balance, not elsewhere classified: Secondary | ICD-10-CM | POA: Diagnosis not present

## 2021-11-15 DIAGNOSIS — R61 Generalized hyperhidrosis: Secondary | ICD-10-CM | POA: Diagnosis not present

## 2021-11-15 DIAGNOSIS — R110 Nausea: Secondary | ICD-10-CM | POA: Insufficient documentation

## 2021-11-15 DIAGNOSIS — M546 Pain in thoracic spine: Secondary | ICD-10-CM | POA: Insufficient documentation

## 2021-11-15 DIAGNOSIS — R059 Cough, unspecified: Secondary | ICD-10-CM | POA: Diagnosis not present

## 2021-11-15 LAB — BASIC METABOLIC PANEL
Anion gap: 13 (ref 5–15)
BUN: 16 mg/dL (ref 6–20)
CO2: 21 mmol/L — ABNORMAL LOW (ref 22–32)
Calcium: 8.9 mg/dL (ref 8.9–10.3)
Chloride: 102 mmol/L (ref 98–111)
Creatinine, Ser: 0.44 mg/dL (ref 0.44–1.00)
GFR, Estimated: >60 mL/min (ref >60)
Glucose, Bld: 310 mg/dL — ABNORMAL HIGH (ref 70–99)
Potassium: 3.7 mmol/L (ref 3.5–5.1)
Sodium: 136 mmol/L (ref 135–145)

## 2021-11-15 LAB — CBC
HCT: 50.6 % — ABNORMAL HIGH (ref 36.0–46.0)
Hemoglobin: 17.3 g/dL — ABNORMAL HIGH (ref 12.0–15.0)
MCH: 31 pg (ref 26.0–34.0)
MCHC: 34.2 g/dL (ref 30.0–36.0)
MCV: 90.7 fL (ref 80.0–100.0)
Platelets: 278 10*3/uL (ref 150–400)
RBC: 5.58 MIL/uL — ABNORMAL HIGH (ref 3.87–5.11)
RDW: 12.5 % (ref 11.5–15.5)
WBC: 15.2 10*3/uL — ABNORMAL HIGH (ref 4.0–10.5)
nRBC: 0 % (ref 0.0–0.2)

## 2021-11-15 LAB — D-DIMER, QUANTITATIVE: D-Dimer, Quant: <0.27 ug/mL-FEU (ref 0.00–0.50)

## 2021-11-15 LAB — TROPONIN I (HIGH SENSITIVITY)
Troponin I (High Sensitivity): 3 ng/L (ref <18)
Troponin I (High Sensitivity): 2 ng/L (ref <18)

## 2021-11-15 LAB — HCG, SERUM, QUALITATIVE: Preg, Serum: NEGATIVE

## 2021-11-15 MED ORDER — ASPIRIN 81 MG PO CHEW: 324.0000 mg | CHEWABLE_TABLET | Freq: Once | ORAL | Status: DC

## 2021-11-15 MED ORDER — KETOROLAC TROMETHAMINE 30 MG/ML IJ SOLN
30.0000 mg | Freq: Once | INTRAMUSCULAR | Status: AC
  Administered 2021-11-15: 30 mg via INTRAVENOUS
  Filled 2021-11-15: qty 1

## 2021-11-15 MED ORDER — OMEPRAZOLE 20 MG PO CPDR: 20.0000 mg | DELAYED_RELEASE_CAPSULE | Freq: Every day | ORAL | 0 refills | Status: DC

## 2021-11-15 NOTE — Discharge Instructions (Addendum)
You were seen in the emergency department for pain in your chest radiating to your neck and back.  You had blood work chest x-ray and EKG that did not show any obvious explanation for your symptoms.  We are starting you on some acid medication for possible reflux.  Will be important for you to follow-up with your regular doctor and return to the emergency department if any worsening or concerning symptoms.

## 2021-11-15 NOTE — ED Triage Notes (Signed)
Pt arrived from home with complaints of chest pain that started last night and started again this morning. Says pain radiated from the right shoulder to center of chest. Has taken 4, 81mg  aspirin this morning with no relief. Says pain 6/10.

## 2021-11-15 NOTE — ED Provider Notes (Signed)
[facility] EMERGENCY DEPARTMENT Provider Note   CSN: [id number] Arrival date & time: 11/15/21  [id number]     History  Chief Complaint  Patient presents with   Chest Pain    Heather Richard is a 41 y.o. female.  She said she started with some pain in her upper back across her shoulders into her neck and chest yesterday afternoon.  She took some aspirin and went to bed and when she woke up this morning the pain was there again.  She denies prior history of same.  It is associated with cold sweats and shortness of breath.  She has had a cough from a sinus infection a few weeks ago.  Nausea no vomiting no abdominal pain no numbness or weakness.  She does endorse tobacco denies any active drug use.  No history of cardiac disease.  The history is provided by the patient.  Chest Pain Pain location:  Substernal area Pain quality: pressure   Pain radiates to:  Neck, L shoulder, R shoulder and upper back Pain severity:  Severe Onset quality:  Sudden Duration:  2 days Timing:  Intermittent Progression:  Unchanged Chronicity:  New Context: at rest   Relieved by:  None tried Worsened by:  Nothing Ineffective treatments:  None tried Associated symptoms: back pain, cough, diaphoresis, nausea and shortness of breath   Associated symptoms: no abdominal pain, no altered mental status, no dizziness, no fever, no numbness, no vomiting and no weakness   Risk factors: diabetes mellitus and smoking        Home Medications Prior to Admission medications   Medication Sig Start Date End Date Taking? Authorizing Provider  ACCU-CHEK GUIDE test strip USE TO TEST GLUCOSE THREE TIME A DAY 01/30/20   [provider]  ibuprofen (ADVIL) 800 MG tablet  01/30/20   [provider]  insulin aspart (NOVOLOG) 100 UNIT/ML injection Inject 18 Units into the skin 3 (three) times daily before meals.    [provider]  insulin glargine (LANTUS) 100 unit/mL SOPN Inject 27 Units into the skin at  bedtime.    [provider]  oxymetazoline (AFRIN) 0.05 % nasal spray Place 1 spray into both nostrils 2 (two) times daily.    [provider]  SUBOXONE 8-2 MG FILM Place 1 strip under the tongue 3 (three) times daily. 09/30/21   [provider]  SUMAtriptan (IMITREX) 50 MG tablet Take 50 mg by mouth every 2 (two) hours as needed for migraine. May repeat in 2 hours if headache persists or recurs.    [provider]      Allergies    Gabapentin, Latex, Tomato, Bupropion, Metformin, Pregabalin, and Tylenol [acetaminophen]    Review of Systems   Review of Systems  Constitutional:  Positive for diaphoresis. Negative for fever.  Respiratory:  Positive for cough and shortness of breath.   Cardiovascular:  Positive for chest pain.  Gastrointestinal:  Positive for nausea. Negative for abdominal pain and vomiting.  Musculoskeletal:  Positive for back pain.  Neurological:  Negative for dizziness, weakness and numbness.    Physical Exam Updated Vital Signs BP (!) 146/102   Pulse 87   Temp 98.4 F (36.9 C)   Resp (!) 21   Ht 5\' 6"  (1.676 m)   Wt 63.5 kg   LMP 10/21/2021 (Approximate)   SpO2 99%   BMI 22.60 kg/m  Physical Exam Vitals and nursing note reviewed.  Constitutional:      General: She is not in acute distress.  Appearance: She is well-developed.  HENT:     Head: Normocephalic and atraumatic.  Eyes:     Conjunctiva/sclera: Conjunctivae normal.  Cardiovascular:     Rate and Rhythm: Normal rate and regular rhythm.     Heart sounds: Normal heart sounds. No murmur heard. Pulmonary:     Effort: Pulmonary effort is normal. No respiratory distress.     Breath sounds: Normal breath sounds.  Abdominal:     Palpations: Abdomen is soft.     Tenderness: There is no abdominal tenderness.  Musculoskeletal:        General: No swelling. Normal range of motion.     Cervical back: Neck supple.     Right lower leg: No tenderness. No edema.     Left  lower leg: No tenderness. No edema.  Skin:    General: Skin is warm and dry.     Capillary Refill: Capillary refill takes less than 2 seconds.  Neurological:     General: No focal deficit present.     Mental Status: She is alert.     ED Results / Procedures / Treatments   Labs (all labs ordered are listed, but only abnormal results are displayed) Labs Reviewed  BASIC METABOLIC PANEL - Abnormal; Notable for the following components:      Result Value   CO2 21 (*)    Glucose, Bld 310 (*)    All other components within normal limits  CBC - Abnormal; Notable for the following components:   WBC 15.2 (*)    RBC 5.58 (*)    Hemoglobin 17.3 (*)    HCT 50.6 (*)    All other components within normal limits  D-DIMER, QUANTITATIVE  HCG, SERUM, QUALITATIVE  TROPONIN I (HIGH SENSITIVITY)  TROPONIN I (HIGH SENSITIVITY)    EKG EKG Interpretation  Date/Time:  Friday November 15 2021 07:20:02 EDT Ventricular Rate:  82 PR Interval:  134 QRS Duration: 105 QT Interval:  409 QTC Calculation: 478 R Axis:   89 Text Interpretation: Sinus rhythm No significant change since prior 5/17 Confirmed by Aletta Edouard [phone]) on 11/15/2021 7:55:18 AM  Radiology DG Chest Port 1 View  Result Date: 11/15/2021 CLINICAL DATA:  41 year old female with history of chest pain radiating to the right shoulder. EXAM: PORTABLE CHEST 1 VIEW COMPARISON:  Chest x-ray 01/19/2020. FINDINGS: Lung volumes are normal. No consolidative airspace disease. No pleural effusions. No pneumothorax. No pulmonary nodule or mass noted. Pulmonary vasculature and the cardiomediastinal silhouette are within normal limits. IMPRESSION: No radiographic evidence of acute cardiopulmonary disease. Electronically Signed   By: Vinnie Langton M.D.   On: 11/15/2021 08:03    Procedures Procedures    Medications Ordered in ED Medications  aspirin chewable tablet 324 mg (has no administration in time range)    ED Course/ Medical Decision  Making/ A&P                           Medical Decision Making Amount and/or Complexity of Data Reviewed Labs: ordered. Radiology: ordered.  Risk OTC drugs. Prescription drug management.   This patient complains of chest upper back neck shoulder pain; this involves an extensive number of treatment Options and is a complaint that carries with it a high risk of complications and morbidity. The differential includes musculoskeletal pain, reflux, pneumonia, pleurisy, ACS, PE  I ordered, reviewed and interpreted labs, which included CBC with elevated white count, chemistries with elevated glucose mildly low bicarb, D-dimer and troponins  negative, pregnancy test negative I ordered medication IV Toradol and reviewed PMP when indicated. I ordered imaging studies which included chest x-ray and I independently    visualized and interpreted imaging which showed no acute findings  Previous records obtained and reviewed in epic including recent orthopedic notes  Cardiac monitoring reviewed, normal sinus rhythm Social determinants considered, no significant barriers Critical Interventions: None  After the interventions stated above, I reevaluated the patient and found patient to be well-appearing stable vitals Admission and further testing considered, no indications for admission or further work-up at this time.  Recommended outpatient follow-up with her PCP.  Return instructions discussed         Final Clinical Impression(s) / ED Diagnoses Final diagnoses:  Nonspecific chest pain    Rx / DC Orders ED Discharge Orders     None         Hayden Rasmussen, MD 11/15/21 1819

## 2021-11-19 ENCOUNTER — Ambulatory Visit: Payer: Medicaid Other | Admitting: Orthopedic Surgery

## 2021-11-19 ENCOUNTER — Encounter: Payer: Self-pay | Admitting: Orthopedic Surgery

## 2021-11-19 DIAGNOSIS — G8929 Other chronic pain: Secondary | ICD-10-CM | POA: Diagnosis not present

## 2021-11-19 DIAGNOSIS — M25511 Pain in right shoulder: Secondary | ICD-10-CM | POA: Diagnosis not present

## 2021-11-19 DIAGNOSIS — S62321D Displaced fracture of shaft of second metacarpal bone, left hand, subsequent encounter for fracture with routine healing: Secondary | ICD-10-CM

## 2021-11-20 ENCOUNTER — Encounter: Payer: Self-pay | Admitting: Orthopedic Surgery

## 2021-11-20 NOTE — Progress Notes (Signed)
Orthopaedic Postop Note  Assessment: Heather Richard is a 41 y.o. female s/p L wrist ORIF and 2nd metacarpal ORIF (DOS: 02/09/20)  Plan: [name] has persistent right shoulder pain.  Pain is primarily posterior aspect of the shoulder.  Unfortunately, she was unable to schedule a physical therapy visit prior to today's clinic visit.  She had COVID, and her previously scheduled appointment was rescheduled to later this week.  I explained to her that PT is the next, crucial step.  She states her understanding.  I will see her back in approximately 1 month.  If she has issues with physical therapy, or cannot tolerate it, she should contact the clinic for an earlier appointment.  Unless she has improvement with therapy, we will obtain an MRI of the right shoulder.   Follow-up: Return in about 4 weeks (around 12/17/2021).    XR at next visit: None  Subjective:  Chief Complaint  Patient presents with   Shoulder Pain    RT shoulder/chronic follow up Pain is worse/waking patient up at night   Hand Pain    LT hand/missed initial appt due to illness-scheduled to see them for eval on 11/22/21 Pain is the same    History of Present Illness: Heather Richard is a 41 y.o. LHD female who returns to clinic today for repeat evaluation of her left hand and right shoulder.  I saw her in clinic about 6 weeks ago.  She continues to have pain in her right shoulder.  This is getting worse.  Pain keeps her up at night.  She did have an appointment scheduled with PT, for an initial evaluation, but contracted COVID.  Therefore her appointment was rescheduled to later this week.  She has not worked with physical therapy yet.   Review of Systems: No fevers or chills Numbness or tingling No Chest Pain No shortness of breath   Objective: LMP 10/21/2021 (Approximate)   Physical Exam:  Evaluation of the right shoulder demonstrates no deformity.  No atrophy is appreciated.  Diffuse tenderness to palpation over the  lateral and posterior aspect of her shoulder.  Forward flexion limited to 110 degrees.  Abduction limited to 90 degrees.  Fingers are warm and well-perfused.  She has tenderness to palpation over the [employer] joint.  Evaluation of the left hand demonstrates well-healed volar and dorsal incisions.  No surrounding erythema or drainage.  She has excellent range of motion of the wrist.  She has mild discomfort over the dorsal aspect of the second metacarpal.  Restricted motion at the MCP, PIP and DIP to the index finger.  She is not quite able to make a full fist.  It is painful with attempted passive range of motion of the left index finger.  Fingers are warm and well-perfused.   IMAGING: I personally ordered and reviewed the following images:  No new imaging obtained today.    Mordecai Rasmussen, MD 11/20/2021 9:04 AM

## 2021-11-22 ENCOUNTER — Encounter (HOSPITAL_COMMUNITY): Payer: Self-pay | Admitting: Occupational Therapy

## 2021-11-22 ENCOUNTER — Ambulatory Visit (HOSPITAL_COMMUNITY): Payer: Medicaid Other | Attending: Orthopedic Surgery | Admitting: Occupational Therapy

## 2021-11-22 DIAGNOSIS — R29898 Other symptoms and signs involving the musculoskeletal system: Secondary | ICD-10-CM

## 2021-11-22 DIAGNOSIS — M25511 Pain in right shoulder: Secondary | ICD-10-CM | POA: Insufficient documentation

## 2021-11-22 DIAGNOSIS — M25611 Stiffness of right shoulder, not elsewhere classified: Secondary | ICD-10-CM | POA: Diagnosis present

## 2021-11-22 DIAGNOSIS — G8929 Other chronic pain: Secondary | ICD-10-CM | POA: Diagnosis present
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
## 2021-11-22 NOTE — Patient Instructions (Signed)

## 2021-11-29 ENCOUNTER — Encounter (HOSPITAL_COMMUNITY): Payer: Self-pay | Admitting: Occupational Therapy

## 2021-11-29 ENCOUNTER — Ambulatory Visit (HOSPITAL_COMMUNITY): Payer: Medicaid Other | Admitting: Occupational Therapy

## 2021-11-29 DIAGNOSIS — M25611 Stiffness of right shoulder, not elsewhere classified: Secondary | ICD-10-CM

## 2021-11-29 DIAGNOSIS — M25511 Pain in right shoulder: Secondary | ICD-10-CM | POA: Diagnosis not present

## 2021-11-29 DIAGNOSIS — R29898 Other symptoms and signs involving the musculoskeletal system: Secondary | ICD-10-CM

## 2021-11-29 DIAGNOSIS — G8929 Other chronic pain: Secondary | ICD-10-CM

## 2021-11-29 NOTE — Therapy (Signed)
OUTPATIENT OCCUPATIONAL THERAPY ORTHO THERAPY NOTE  Patient Name: Heather Richard MRN: [id number] DOB:11/19/1980, 41 y.o., female Today's Date: 11/29/2021  PCP: [facility] REFERRING PROVIDER: Oliver Barre, MD   OT End of Session - 11/29/21 1028     Visit Number 2    Number of Visits 8    Date for OT Re-Evaluation 12/20/21    Authorization Type [place] [employer]    Authorization Time Period Authorized 7 visits    Authorization - Visit Number 1    Authorization - Number of Visits 7    OT Start Time 0730    OT Stop Time 0815    OT Time Calculation (min) 45 min    Activity Tolerance Patient tolerated treatment well    Behavior During Therapy WFL for tasks assessed/performed             Past Medical History:  Diagnosis Date   Diabetes mellitus without complication (HCC)    Hyperlipidemia    Past Surgical History:  Procedure Laterality Date   OPEN REDUCTION INTERNAL FIXATION (ORIF) DISTAL RADIAL FRACTURE Left 02/09/2020   Procedure: OPEN REDUCTION INTERNAL FIXATION (ORIF) DISTAL RADIAL FRACTURE;  Surgeon: Oliver Barre, MD;  Location: [facility];  Service: Orthopedics;  Laterality: Left;   OPEN REDUCTION INTERNAL FIXATION (ORIF) METACARPAL Left 02/09/2020   Procedure: OPEN REDUCTION INTERNAL FIXATION (ORIF) METACARPAL;  Surgeon: Oliver Barre, MD;  Location: [facility];  Service: Orthopedics;  Laterality: Left;  Left 2nd metacarpal fracture; possible pinning   TYMPANOSTOMY TUBE PLACEMENT  2011   Patient Active Problem List   Diagnosis Date Noted   Chest pain 04/16/2020   Major depression, chronic 10/20/2019   Migraine 10/20/2019   Opioid use, unspecified with unspecified opioid-induced disorder (HCC) 10/20/2019   Vision loss, bilateral 10/20/2019   Type 1 diabetes (HCC) 05/30/2019    ONSET DATE: 01/19/2020  REFERRING DIAG: Chronic R shoulder pain  THERAPY DIAG:  Chronic right shoulder pain  Stiffness of right shoulder, not elsewhere classified  Other  symptoms and signs involving the musculoskeletal system  Rationale for Evaluation and Treatment Rehabilitation  SUBJECTIVE:   SUBJECTIVE STATEMENT: "I can't even fix a bowl of cereal right now"  PRECAUTIONS: None  WEIGHT BEARING RESTRICTIONS No  PAIN:  Are you having pain? Yes: NPRS scale: 10/10 Pain location: posterior shoulder and trapezius Pain description: excruciating Aggravating factors: any kind of lifting, movement Relieving factors: none  FALLS: Has patient fallen in last 6 months? No  PATIENT GOALS To reduce pain  OBJECTIVE:   HAND DOMINANCE: Left  FUNCTIONAL OUTCOME MEASURES: Quick Dash: 90.91  UPPER EXTREMITY ROM     Active ROM Right eval  Shoulder flexion 76  Shoulder abduction 64  Shoulder internal rotation WFL  Shoulder external rotation WFL  (Blank rows = not tested)   UPPER EXTREMITY MMT:     MMT Right eval  Shoulder flexion 4-/5  Shoulder abduction 4/5  Shoulder adduction 4-/5  Shoulder extension 4-/5  Shoulder internal rotation 3/5  Shoulder external rotation 3/5  Elbow flexion 3+/5  Elbow extension 3+/5  (Blank rows = not tested)  HAND FUNCTION: Grip strength: Right: 30 lbs; Left: 40 lbs  SENSATION: The D1, D2, and D3, are tingly.  EDEMA: No swelling noted  OBSERVATIONS: R side is slumped and rounded. Severe fascial restrictions at the trapezius, axillary region, and subscapularis. There is a separation felt near the [facility] joint area.    TODAY'S TREATMENT:  11/29/21 -Manual Therapy: myofascial release and light pressure  trigger point on entire R shoulder region, to address pain and fascial restrictions, in order to improve ROM. -P/ROM: flexion, abduction, horizontal abduction, er/IR, 1x10 5 sec hold at end range -AA/ROM: flexion, abduction, protraction, horizontal abduction, er/IR, 1x10 -Wall Slides: flexion, 1x10    PATIENT EDUCATION: Education details: [employer] Person educated: Patient Education method: Explanation,  Demonstration, and Handouts Education comprehension: verbalized understanding and returned demonstration   HOME EXERCISE PROGRAM: Eval: Table slides 10/20: Wall slides  GOALS: Goals reviewed with patient? Yes  SHORT TERM GOALS: Target date:  12/20/21     Pt will be provided with and educated on HEP to improve mobility in RUE required for ADL completion.   Goal status: IN PROGRESS  2.  Pt will decrease pain in RUE to 4/10 or less in order to sleep for 4+ consecutive hours without waking due to pain.   Goal status: IN PROGRESS  3.  Pt will decrease RUE fascial restrictions to minimal amounts or less to improve mobility required for overhead reaching tasks.   Goal status: IN PROGRESS  4.  Pt will increase A/ROM of RUE to [facility] to improve ability to reach overhead and behind back during dressing and bathing tasks.   Goal status: IN PROGRESS  5.  Pt will increase strength in RUE to 4+/5 to improve ability to perform lifting tasks during cooking and cleaning.   Goal status: IN PROGRESS  6.  Pt will increase right grip strength by 15# to improve ability to grasp and hold pots and pans during meal preparation.   Goal status: IN PROGRESS   ASSESSMENT:  CLINICAL IMPRESSION: Pt presents to therapy this session with continued 10/10 pain. She has severe fascial restrictions along her trapezius, scapula, and axillary region, which are all tender to the touch. Therapist providing light trigger point and myofascial release to these areas. Additionally working on ROM this session with P/ROM and AA/ROM. She was only able to tolerate 50% of ROM due to pain and "heaviness" in her arm. Therapist providing cuing for positioning and proper mechanics. She will continue to benefit from skilled OT to maximize ROM and strength while decreasing pain, in order to improve independence with ADL's and IADL's.    PLAN: OT FREQUENCY: 2x/week  OT DURATION: 4 weeks  PLANNED INTERVENTIONS: self care/ADL  training, therapeutic exercise, therapeutic activity, manual therapy, passive range of motion, ultrasound, moist heat, cryotherapy, patient/family education, energy conservation, and coping strategies training  RECOMMENDED OTHER SERVICES: N/A  CONSULTED AND AGREED WITH PLAN OF CARE: Patient  PLAN FOR NEXT SESSION: Manual, P/ROM, AA/ROM, Isometrics, Wall Slides   Yenty Bloch [facility] [phone] 11/29/2021, 10:33 AM

## 2021-12-03 ENCOUNTER — Ambulatory Visit (HOSPITAL_COMMUNITY): Payer: Medicaid Other | Admitting: Occupational Therapy

## 2021-12-03 DIAGNOSIS — M25611 Stiffness of right shoulder, not elsewhere classified: Secondary | ICD-10-CM

## 2021-12-03 DIAGNOSIS — M25511 Pain in right shoulder: Secondary | ICD-10-CM | POA: Diagnosis not present

## 2021-12-03 DIAGNOSIS — G8929 Other chronic pain: Secondary | ICD-10-CM

## 2021-12-03 DIAGNOSIS — R29898 Other symptoms and signs involving the musculoskeletal system: Secondary | ICD-10-CM

## 2021-12-03 NOTE — Therapy (Signed)
OUTPATIENT OCCUPATIONAL THERAPY ORTHO THERAPY NOTE  Patient Name: Heather Richard MRN: [id number] DOB:September 12, 1980, 41 y.o., female Today's Date: 12/04/2021  PCP: [facility] REFERRING PROVIDER: Mordecai Rasmussen, MD   OT End of Session - 12/04/21 1531     Visit Number 3    Number of Visits 8    Date for OT Re-Evaluation 12/20/21    Authorization Type [place] [employer]    Authorization Time Period Authorized 7 visits    Authorization - Visit Number 2    Authorization - Number of Visits 7    OT Start Time 1032    OT Stop Time 1115    OT Time Calculation (min) 43 min    Activity Tolerance Patient tolerated treatment well    Behavior During Therapy WFL for tasks assessed/performed             Past Medical History:  Diagnosis Date   Diabetes mellitus without complication ([place])    Hyperlipidemia    Past Surgical History:  Procedure Laterality Date   OPEN REDUCTION INTERNAL FIXATION (ORIF) DISTAL RADIAL FRACTURE Left 02/09/2020   Procedure: OPEN REDUCTION INTERNAL FIXATION (ORIF) DISTAL RADIAL FRACTURE;  Surgeon: Mordecai Rasmussen, MD;  Location: [facility];  Service: Orthopedics;  Laterality: Left;   OPEN REDUCTION INTERNAL FIXATION (ORIF) METACARPAL Left 02/09/2020   Procedure: OPEN REDUCTION INTERNAL FIXATION (ORIF) METACARPAL;  Surgeon: Mordecai Rasmussen, MD;  Location: [facility];  Service: Orthopedics;  Laterality: Left;  Left 2nd metacarpal fracture; possible pinning   TYMPANOSTOMY TUBE PLACEMENT  2011   Patient Active Problem List   Diagnosis Date Noted   Chest pain 04/16/2020   Major depression, chronic 10/20/2019   Migraine 10/20/2019   Opioid use, unspecified with unspecified opioid-induced disorder ([place]) 10/20/2019   Vision loss, bilateral 10/20/2019   Type 1 diabetes ([place]) 05/30/2019    ONSET DATE: 01/19/2020  REFERRING DIAG: Chronic R shoulder pain  THERAPY DIAG:  Chronic right shoulder pain  Stiffness of right shoulder, not elsewhere classified  Other  symptoms and signs involving the musculoskeletal system  Rationale for Evaluation and Treatment Rehabilitation  SUBJECTIVE:   SUBJECTIVE STATEMENT: "I hurt so bad this weekend I couldn't even get out of bed, my husband had to pick me up and put me in the shower to see if the hot water would give me relief"  PRECAUTIONS: None  WEIGHT BEARING RESTRICTIONS No  PAIN:  Are you having pain? Yes: NPRS scale: 10/10 Pain location: posterior shoulder and trapezius Pain description: excruciating Aggravating factors: any kind of lifting, movement Relieving factors: none  FALLS: Has patient fallen in last 6 months? No  PATIENT GOALS To reduce pain  OBJECTIVE:   HAND DOMINANCE: Left  FUNCTIONAL OUTCOME MEASURES: Quick Dash: 90.91  UPPER EXTREMITY ROM     Active ROM Right eval  Shoulder flexion 76  Shoulder abduction 64  Shoulder internal rotation WFL  Shoulder external rotation WFL  (Blank rows = not tested)   UPPER EXTREMITY MMT:     MMT Right eval  Shoulder flexion 4-/5  Shoulder abduction 4/5  Shoulder adduction 4-/5  Shoulder extension 4-/5  Shoulder internal rotation 3/5  Shoulder external rotation 3/5  Elbow flexion 3+/5  Elbow extension 3+/5  (Blank rows = not tested)  HAND FUNCTION: Grip strength: Right: 30 lbs; Left: 40 lbs  SENSATION: The D1, D2, and D3, are tingly.  EDEMA: No swelling noted  OBSERVATIONS: R side is slumped and rounded. Severe fascial restrictions at the trapezius, axillary region,  and subscapularis. There is a separation felt near the [facility] joint area.    TODAY'S TREATMENT:  12/04/21 -Manual Therapy: myofascial release and light pressure trigger point on entire R shoulder region, to address pain and fascial restrictions, in order to improve ROM. -P/ROM: flexion, abduction, horizontal abduction, er/IR, 1x10  -AA/ROM: flexion, abduction, protraction, horizontal abduction, er/IR, 1x10  11/29/21 -Manual Therapy: myofascial release and  light pressure trigger point on entire R shoulder region, to address pain and fascial restrictions, in order to improve ROM. -P/ROM: flexion, abduction, horizontal abduction, er/IR, 1x10 5 sec hold at end range -AA/ROM: flexion, abduction, protraction, horizontal abduction, er/IR, 1x10 -Wall Slides: flexion, 1x10    PATIENT EDUCATION: Education details: Reviewed HEP Person educated: Patient Education method: Explanation, Demonstration, and Handouts Education comprehension: verbalized understanding and returned demonstration   HOME EXERCISE PROGRAM: Eval: Table slides 10/20: Wall slides  GOALS: Goals reviewed with patient? Yes  SHORT TERM GOALS: Target date:  12/20/21     Pt will be provided with and educated on HEP to improve mobility in RUE required for ADL completion.   Goal status: IN PROGRESS  2.  Pt will decrease pain in RUE to 4/10 or less in order to sleep for 4+ consecutive hours without waking due to pain.   Goal status: IN PROGRESS  3.  Pt will decrease RUE fascial restrictions to minimal amounts or less to improve mobility required for overhead reaching tasks.   Goal status: IN PROGRESS  4.  Pt will increase A/ROM of RUE to [facility] to improve ability to reach overhead and behind back during dressing and bathing tasks.   Goal status: IN PROGRESS  5.  Pt will increase strength in RUE to 4+/5 to improve ability to perform lifting tasks during cooking and cleaning.   Goal status: IN PROGRESS  6.  Pt will increase right grip strength by 15# to improve ability to grasp and hold pots and pans during meal preparation.   Goal status: IN PROGRESS   ASSESSMENT:  CLINICAL IMPRESSION: Pt presents to therapy with extreme pain with all manual and ROM tasks. Her trapezius, scapular region, and biceps have severe restrictions. She could only tolerate light myofascial release and trigger point. With P/ROM, she could only tolerate approximately 45% of ROM. Therapist providing  cuing for positioning and proper mechanics with AA/ROM. Therapist recommended that pt follow up with her MD sooner rather than later due to continuing and worsening pain with OT. At this time, she will continue to benefit from skilled OT to maximize ROM and strength while decreasing pain, in order to improve independence with ADL's and IADL's.    PLAN: OT FREQUENCY: 2x/week  OT DURATION: 4 weeks  PLANNED INTERVENTIONS: self care/ADL training, therapeutic exercise, therapeutic activity, manual therapy, passive range of motion, ultrasound, moist heat, cryotherapy, patient/family education, energy conservation, and coping strategies training  RECOMMENDED OTHER SERVICES: N/A  CONSULTED AND AGREED WITH PLAN OF CARE: Patient  PLAN FOR NEXT SESSION: Manual, P/ROM, AA/ROM, Isometrics, Wall Slides   Aysia Lowder [facility] [phone] 12/04/2021, 3:32 PM

## 2021-12-04 ENCOUNTER — Encounter (HOSPITAL_COMMUNITY): Payer: Self-pay | Admitting: Occupational Therapy

## 2021-12-06 ENCOUNTER — Encounter: Payer: Self-pay | Admitting: Orthopedic Surgery

## 2021-12-06 ENCOUNTER — Encounter (HOSPITAL_COMMUNITY): Payer: Medicaid Other | Admitting: Occupational Therapy

## 2021-12-06 ENCOUNTER — Ambulatory Visit (INDEPENDENT_AMBULATORY_CARE_PROVIDER_SITE_OTHER): Payer: Medicaid Other | Admitting: Orthopedic Surgery

## 2021-12-06 VITALS — Ht 65.03 in | Wt 140.0 lb

## 2021-12-06 DIAGNOSIS — G8929 Other chronic pain: Secondary | ICD-10-CM

## 2021-12-06 DIAGNOSIS — M25511 Pain in right shoulder: Secondary | ICD-10-CM | POA: Diagnosis not present

## 2021-12-06 DIAGNOSIS — M79601 Pain in right arm: Secondary | ICD-10-CM | POA: Insufficient documentation

## 2021-12-06 MED ORDER — CYCLOBENZAPRINE HCL 10 MG PO TABS: 10.0000 mg | ORAL_TABLET | Freq: Two times a day (BID) | ORAL | 0 refills | Status: DC | PRN

## 2021-12-06 NOTE — Patient Instructions (Signed)
MRI of the right shoulder  Once you have this scheduled, please contact the clinic to schedule a follow up appointment

## 2021-12-07 NOTE — Progress Notes (Signed)
Orthopaedic Postop Note  Assessment: Heather Richard is a 41 y.o. female s/p L wrist ORIF and 2nd metacarpal ORIF (DOS: 02/09/20)  Plan: [name] continues to have right shoulder pain.  She has had a few sessions with physical therapy, unfortunately, this is worsened her pain.  I do not think that she has a frozen shoulder.  We will obtain a shoulder MRI.  Once the results are available, we will see her back in clinic.  Okay to continue working with physical therapy, and take medications as needed.  I provided her with prescription for Flexeril.  Follow-up: Return for After MRI.    XR at next visit: None  Subjective:  Chief Complaint  Patient presents with   Shoulder Pain    Right/' physical therapy told her she needs MRI and a nerve study done and that she has AC joint separation    Arm Pain    Entire right arm hurts     History of Present Illness: Heather Richard is a 41 y.o. LHD female who returns to clinic today for repeat evaluation of her left hand and right shoulder.  Patient returns to clinic early, after initiating physical therapy.  Her pain is gotten worse.  Therapist is suggesting an MRI and a nerve conduction study.  She has difficulty with overhead motion.  Occasionally, she will pain radiating to her right hand.   Review of Systems: No fevers or chills Numbness or tingling No Chest Pain No shortness of breath   Objective: Ht 5' 5.03" (1.652 m)   Wt 140 lb (63.5 kg)   LMP 10/21/2021 (Approximate)   BMI 23.28 kg/m   Physical Exam:  Evaluation of the right shoulder demonstrates no deformity.  No atrophy is appreciated.  Diffuse tenderness to palpation over the lateral and posterior aspect of her shoulder.  Forward flexion limited to 110 degrees.  Abduction limited to 90 degrees.  Fingers are warm and well-perfused.  She has tenderness to palpation over the [facility] joint.   IMAGING: I personally ordered and reviewed the following images:  No new imaging obtained  today.    Heather Rasmussen, MD 12/07/2021 10:24 PM

## 2021-12-10 ENCOUNTER — Encounter (HOSPITAL_COMMUNITY): Payer: Medicaid Other | Admitting: Occupational Therapy

## 2021-12-10 ENCOUNTER — Telehealth (HOSPITAL_COMMUNITY): Payer: Self-pay | Admitting: Occupational Therapy

## 2021-12-10 NOTE — Telephone Encounter (Signed)
Called pt regarding no show for today's appointment. No answer, left voicemail reminding pt of her next appt on 11/2 at [facility] and requested pt to call if unable to come.    Guadelupe Sabin, OTR/L  [phone] 12/10/21

## 2021-12-12 ENCOUNTER — Encounter (HOSPITAL_COMMUNITY): Payer: Medicaid Other | Admitting: Occupational Therapy

## 2021-12-16 ENCOUNTER — Ambulatory Visit (HOSPITAL_COMMUNITY): Payer: Medicaid Other | Attending: Orthopedic Surgery | Admitting: Occupational Therapy

## 2021-12-16 DIAGNOSIS — M25511 Pain in right shoulder: Secondary | ICD-10-CM | POA: Insufficient documentation

## 2021-12-16 DIAGNOSIS — R29898 Other symptoms and signs involving the musculoskeletal system: Secondary | ICD-10-CM

## 2021-12-16 DIAGNOSIS — G8929 Other chronic pain: Secondary | ICD-10-CM

## 2021-12-16 DIAGNOSIS — M25611 Stiffness of right shoulder, not elsewhere classified: Secondary | ICD-10-CM | POA: Diagnosis present

## 2021-12-16 NOTE — Therapy (Signed)
OUTPATIENT OCCUPATIONAL THERAPY ORTHO THERAPY NOTE  Patient Name: Heather Richard MRN: [id number] DOB:Oct 27, 1980, 41 y.o., female Today's Date: 12/17/2021  PCP: [facility] REFERRING PROVIDER: Oliver Barre, MD  END OF SESSION:  OT End of Session - 12/16/21 1205     Visit Number 4    Number of Visits 8    Date for OT Re-Evaluation 12/20/21    Authorization Type [place] [employer]    Authorization Time Period Authorized 7 visits    Authorization - Visit Number 3    Authorization - Number of Visits 7    OT Start Time 1115    OT Stop Time 1200    OT Time Calculation (min) 45 min    Activity Tolerance Patient limited by pain    Behavior During Therapy [facility] for tasks assessed/performed             Past Medical History:  Diagnosis Date   Diabetes mellitus without complication (HCC)    Hyperlipidemia    Past Surgical History:  Procedure Laterality Date   OPEN REDUCTION INTERNAL FIXATION (ORIF) DISTAL RADIAL FRACTURE Left 02/09/2020   Procedure: OPEN REDUCTION INTERNAL FIXATION (ORIF) DISTAL RADIAL FRACTURE;  Surgeon: Oliver Barre, MD;  Location: [facility];  Service: Orthopedics;  Laterality: Left;   OPEN REDUCTION INTERNAL FIXATION (ORIF) METACARPAL Left 02/09/2020   Procedure: OPEN REDUCTION INTERNAL FIXATION (ORIF) METACARPAL;  Surgeon: Oliver Barre, MD;  Location: [facility];  Service: Orthopedics;  Laterality: Left;  Left 2nd metacarpal fracture; possible pinning   TYMPANOSTOMY TUBE PLACEMENT  2011   Patient Active Problem List   Diagnosis Date Noted   Pain in right arm 12/06/2021   Chest pain 04/16/2020   Major depression, chronic 10/20/2019   Migraine 10/20/2019   Opioid use, unspecified with unspecified opioid-induced disorder (HCC) 10/20/2019   Vision loss, bilateral 10/20/2019   Type 1 diabetes mellitus with hyperglycemia (HCC) 10/20/2019   Type 1 diabetes (HCC) 05/30/2019    ONSET DATE: 01/19/2020  REFERRING DIAG: Chronic R shoulder pain  THERAPY  DIAG:  Chronic right shoulder pain  Stiffness of right shoulder, not elsewhere classified  Other symptoms and signs involving the musculoskeletal system  Rationale for Evaluation and Treatment Rehabilitation  SUBJECTIVE:   SUBJECTIVE STATEMENT: "I'm miserable, I can't even pull up my own hair."  PRECAUTIONS: None  WEIGHT BEARING RESTRICTIONS No  PAIN:  Are you having pain? Yes: NPRS scale: 10/10 Pain location: posterior shoulder and trapezius Pain description: excruciating Aggravating factors: any kind of lifting, movement Relieving factors: none  FALLS: Has patient fallen in last 6 months? No  PATIENT GOALS To reduce pain  OBJECTIVE:   HAND DOMINANCE: Left  FUNCTIONAL OUTCOME MEASURES: Quick Dash: 90.91  UPPER EXTREMITY ROM     Active ROM Right eval  Shoulder flexion 76  Shoulder abduction 64  Shoulder internal rotation WFL  Shoulder external rotation WFL  (Blank rows = not tested)   UPPER EXTREMITY MMT:     MMT Right eval  Shoulder flexion 4-/5  Shoulder abduction 4/5  Shoulder adduction 4-/5  Shoulder extension 4-/5  Shoulder internal rotation 3/5  Shoulder external rotation 3/5  Elbow flexion 3+/5  Elbow extension 3+/5  (Blank rows = not tested)  HAND FUNCTION: Grip strength: Right: 30 lbs; Left: 40 lbs  SENSATION: The D1, D2, and D3, are tingly.  EDEMA: No swelling noted  OBSERVATIONS: R side is slumped and rounded. Severe fascial restrictions at the trapezius, axillary region, and subscapularis. There is a separation  felt near the [facility] joint area.    TODAY'S TREATMENT:  12/16/21 -Manual Therapy: myofascial release and light pressure trigger point on entire R shoulder region, to address pain and fascial restrictions, in order to improve ROM. -Neck Stretches: lateral towel stretches, L head tilts with L hand on head, 8x5" holds -P/ROM: flexion, abduction, horizontal abduction, er/IR, 1x10 -AA/ROM: protraction, flexion,  1x10  12/04/21 -Manual Therapy: myofascial release and light pressure trigger point on entire R shoulder region, to address pain and fascial restrictions, in order to improve ROM. -P/ROM: flexion, abduction, horizontal abduction, er/IR, 1x10  -AA/ROM: flexion, abduction, protraction, horizontal abduction, er/IR, 1x10  11/29/21 -Manual Therapy: myofascial release and light pressure trigger point on entire R shoulder region, to address pain and fascial restrictions, in order to improve ROM. -P/ROM: flexion, abduction, horizontal abduction, er/IR, 1x10 5 sec hold at end range -AA/ROM: flexion, abduction, protraction, horizontal abduction, er/IR, 1x10 -Wall Slides: flexion, 1x10    PATIENT EDUCATION: Education details: Discussed pain management techniques  Person educated: Patient Education method: Explanation, Demonstration, and Handouts Education comprehension: verbalized understanding and returned demonstration   HOME EXERCISE PROGRAM: Eval: Table slides 10/20: Wall slides  GOALS: Goals reviewed with patient? Yes  SHORT TERM GOALS: Target date:  12/20/21     Pt will be provided with and educated on HEP to improve mobility in RUE required for ADL completion.   Goal status: IN PROGRESS  2.  Pt will decrease pain in RUE to 4/10 or less in order to sleep for 4+ consecutive hours without waking due to pain.   Goal status: IN PROGRESS  3.  Pt will decrease RUE fascial restrictions to minimal amounts or less to improve mobility required for overhead reaching tasks.   Goal status: IN PROGRESS  4.  Pt will increase A/ROM of RUE to [facility] to improve ability to reach overhead and behind back during dressing and bathing tasks.   Goal status: IN PROGRESS  5.  Pt will increase strength in RUE to 4+/5 to improve ability to perform lifting tasks during cooking and cleaning.   Goal status: IN PROGRESS  6.  Pt will increase right grip strength by 15# to improve ability to grasp and hold  pots and pans during meal preparation.   Goal status: IN PROGRESS   ASSESSMENT:  CLINICAL IMPRESSION: Pt presents to therapy with continued extreme pain with all manual and ROM tasks. She reports burning pain radiating from her trapezius down to her fingers. This session was limited due to patients intense pain. Therapist focused session on manual therapy to address severe fascial restrictions. All ROM was limited to less than 40% of functional range. At this time, she will continue to benefit from skilled OT to maximize ROM and strength while decreasing pain, in order to improve independence with ADL's and IADL's.    PLAN: OT FREQUENCY: 2x/week  OT DURATION: 4 weeks  PLANNED INTERVENTIONS: self care/ADL training, therapeutic exercise, therapeutic activity, manual therapy, passive range of motion, ultrasound, moist heat, cryotherapy, patient/family education, energy conservation, and coping strategies training  RECOMMENDED OTHER SERVICES: N/A  CONSULTED AND AGREED WITH PLAN OF CARE: Patient  PLAN FOR NEXT SESSION: Manual, P/ROM, AA/ROM, Isometrics, Wall Slides   Heather Richard [facility] [phone] 12/17/2021, 7:56 PM

## 2021-12-17 ENCOUNTER — Ambulatory Visit: Payer: Medicaid Other | Admitting: Orthopedic Surgery

## 2021-12-17 ENCOUNTER — Encounter (HOSPITAL_COMMUNITY): Payer: Self-pay | Admitting: Occupational Therapy

## 2021-12-19 ENCOUNTER — Encounter (HOSPITAL_COMMUNITY): Payer: Medicaid Other | Admitting: Occupational Therapy

## 2022-01-01 ENCOUNTER — Ambulatory Visit (HOSPITAL_COMMUNITY)
Admission: RE | Admit: 2022-01-01 | Discharge: 2022-01-01 | Disposition: A | Discharge status: Home / Self Care | Payer: Medicaid Other | Source: Ambulatory Visit | Attending: Orthopedic Surgery | Admitting: Orthopedic Surgery

## 2022-01-01 DIAGNOSIS — M25511 Pain in right shoulder: Secondary | ICD-10-CM | POA: Insufficient documentation

## 2022-01-01 DIAGNOSIS — G8929 Other chronic pain: Secondary | ICD-10-CM | POA: Insufficient documentation

## 2022-01-06 ENCOUNTER — Telehealth (HOSPITAL_COMMUNITY): Payer: Self-pay | Admitting: Occupational Therapy

## 2022-01-06 NOTE — Telephone Encounter (Signed)
OT spoke with pt regarding missed appointments. She reported that she is out of town due to a death in the family and will call once back in town to reschedule. Informed pt that she may need a new referral from her MD.

## 2022-01-14 ENCOUNTER — Encounter: Payer: Self-pay | Admitting: Orthopedic Surgery

## 2022-01-14 ENCOUNTER — Ambulatory Visit (INDEPENDENT_AMBULATORY_CARE_PROVIDER_SITE_OTHER): Payer: Medicaid Other | Admitting: Orthopedic Surgery

## 2022-01-14 VITALS — BP 141/90 | HR 98 | Ht 65.0 in | Wt 140.0 lb

## 2022-01-14 DIAGNOSIS — M25511 Pain in right shoulder: Secondary | ICD-10-CM | POA: Diagnosis not present

## 2022-01-14 DIAGNOSIS — R937 Abnormal findings on diagnostic imaging of other parts of musculoskeletal system: Secondary | ICD-10-CM

## 2022-01-14 DIAGNOSIS — G8929 Other chronic pain: Secondary | ICD-10-CM

## 2022-01-14 DIAGNOSIS — M542 Cervicalgia: Secondary | ICD-10-CM

## 2022-01-14 MED ORDER — PREDNISONE 10 MG (21) PO TBPK: ORAL_TABLET | ORAL | 0 refills | Status: DC

## 2022-01-14 MED ORDER — PREGABALIN 50 MG PO CAPS: 50.0000 mg | ORAL_CAPSULE | Freq: Three times a day (TID) | ORAL | 0 refills | Status: DC

## 2022-01-14 NOTE — Patient Instructions (Signed)
Referral to Dr. Christell Constant for further discussion regarding the pain in your neck, radiating into the shoulder.   Try Lyrica - if you have issues, stop taking this medication.   Prednisone for the pain, take as prescribed.

## 2022-01-16 ENCOUNTER — Encounter: Payer: Self-pay | Admitting: Orthopedic Surgery

## 2022-01-16 NOTE — Progress Notes (Signed)
Orthopaedic Postop Note  Assessment: Heather Richard is a 41 y.o. female s/p L wrist ORIF and 2nd metacarpal ORIF (DOS: 02/09/20)  Ongoing right shoulder pain, with radiating pain into her right hand.  Plan: [name] continues to have right shoulder pain.  Pain starts in the shoulder blade, and radiates through her shoulder into her hand.  She also has some pain radiating up into her neck.  She describes periods of numbness and tingling.  We reviewed the MRI of the right shoulder, which is with out identifiable pathology.  No surgical intervention is warranted.  At this point, I think we need to revisit her cervical spine.  She expressed frustration with the prior neurosurgery consult.  As such, we will have her see Dr. Christell Constant in [place].  We also discussed possibility of proceeding with injections, but she would like to discuss this with the spine surgeon prior to consideration for an injection.  I prescribed her a prednisone Dosepak.  In addition, she will try some Lyrica.  She does have an issue with gabapentin, but is willing to try the medication.  If she has any issues with the Lyrica, she is to stop this medication.  Follow-up: Return if symptoms worsen or fail to improve.    XR at next visit: None  Subjective:  Chief Complaint  Patient presents with   Shoulder Pain    Rt shoulder MRI results. Pt states has a burning pain that has caused vomiting.     History of Present Illness: Heather Richard is a 41 y.o. LHD female who returns to clinic today for repeat evaluation of her  right shoulder.  She continues to have pain in the right shoulder.  She states that it starts in the shoulder blade, and radiates through her shoulder and into her hand.  Also has some radiating pain into her neck.  She describes a burning type pain.  It has gotten severe enough, which has caused her to vomit.   Review of Systems: No fevers or chills + Numbness or tingling No Chest Pain No shortness of  breath   Objective: BP (!) 141/90   Pulse 98   Ht 5\' 5"  (1.651 m)   Wt 140 lb (63.5 kg)   BMI 23.30 kg/m   Physical Exam:  Evaluation of the right shoulder demonstrates no deformity.  No atrophy is appreciated.  Diffuse tenderness to palpation over the lateral and posterior aspect of her shoulder.  Forward flexion limited to 110 degrees.  Abduction limited to 90 degrees.  Fingers are warm and well-perfused.  She has tenderness to palpation over the [facility] joint.   IMAGING: I personally ordered and reviewed the following images:  Right shoulder MRI  IMPRESSION: Mild distal supraspinatus and infraspinatus tendinosis. No high-grade or retracted cuff tear. No significant muscle atrophy.   Minimal AC joint arthropathy.   No evidence of labral tear.  Intact biceps tendon.   No MRI findings to suggest adhesive capsulitis, as clinically questioned.  [facility], MD 01/16/2022 10:31 AM

## 2022-01-29 ENCOUNTER — Ambulatory Visit: Payer: Medicaid Other | Admitting: Orthopedic Surgery

## 2022-02-26 ENCOUNTER — Other Ambulatory Visit: Payer: Self-pay | Admitting: Orthopedic Surgery

## 2022-02-26 DIAGNOSIS — M5412 Radiculopathy, cervical region: Secondary | ICD-10-CM

## 2022-02-26 DIAGNOSIS — R937 Abnormal findings on diagnostic imaging of other parts of musculoskeletal system: Secondary | ICD-10-CM

## 2022-02-26 DIAGNOSIS — G8929 Other chronic pain: Secondary | ICD-10-CM

## 2022-02-26 DIAGNOSIS — M542 Cervicalgia: Secondary | ICD-10-CM

## 2022-03-07 ENCOUNTER — Telehealth: Payer: Self-pay | Admitting: Orthopedic Surgery

## 2022-03-07 NOTE — Telephone Encounter (Signed)
Heather Richard from [place] neurosurgery called Patient was seen there but the notes indicate she was not happy and wanted to be seen at [facility] instead. Heather Richard has asked if referral was sent to [facility] Dr Laurance Flatten, looks like that is where it was supposed to go , not back to Neurosurgery.  She wanted to clarify referral was sent to correct location  It does look like referral was sent to [facility] but patient no showed

## 2022-03-08 NOTE — Telephone Encounter (Signed)
Patient no showed for her referral appt w/ Dr.Moore and per communication note in that referral, they will not be rescheduling the patient. Per note from Amy L, patient was not happy w/ [facility] when she previously saw them. Waiting on response before reaching out to patient to ask if she would like to go back to [facility].

## 2022-03-11 ENCOUNTER — Ambulatory Visit (INDEPENDENT_AMBULATORY_CARE_PROVIDER_SITE_OTHER): Payer: Medicaid Other

## 2022-03-11 ENCOUNTER — Encounter: Payer: Self-pay | Admitting: Orthopedic Surgery

## 2022-03-11 ENCOUNTER — Ambulatory Visit (INDEPENDENT_AMBULATORY_CARE_PROVIDER_SITE_OTHER): Payer: Medicaid Other | Admitting: Orthopedic Surgery

## 2022-03-11 VITALS — BP 125/85 | HR 102 | Ht 65.0 in | Wt 140.0 lb

## 2022-03-11 DIAGNOSIS — S143XXA Injury of brachial plexus, initial encounter: Secondary | ICD-10-CM

## 2022-03-11 DIAGNOSIS — M542 Cervicalgia: Secondary | ICD-10-CM
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
## 2022-03-18 ENCOUNTER — Other Ambulatory Visit: Payer: Self-pay

## 2022-03-18 ENCOUNTER — Encounter: Payer: Self-pay | Admitting: Neurology

## 2022-03-18 DIAGNOSIS — R202 Paresthesia of skin: Secondary | ICD-10-CM

## 2022-03-19 ENCOUNTER — Encounter: Payer: Self-pay | Admitting: Neurology

## 2022-03-25 ENCOUNTER — Encounter: Payer: Self-pay | Admitting: Physical Medicine and Rehabilitation

## 2022-03-28 ENCOUNTER — Ambulatory Visit: Payer: Medicaid Other | Admitting: Neurology

## 2022-03-28 DIAGNOSIS — R202 Paresthesia of skin: Secondary | ICD-10-CM | POA: Diagnosis not present

## 2022-03-28 NOTE — Procedures (Signed)
  [facility]  [place], [address]  [place], [place] [address] Tel: [phone] Fax: [phone] Test Date:  03/28/2022  Patient: Heather Richard DOB: 1980/03/18 Physician: Narda Amber, DO  Sex: Female Height: 5' 5"$  Ref Phys: Ileene Rubens, MD  ID#: [id number]   Technician:    History: This is a 42 year-old female referred for evaluation of right shoulder and arm pain.  NCV & EMG Findings: Extensive electrodiagnostic testing of the right upper extremity shows: Right median, ulnar, radial, medial antebrachial, and lateral antebrachial cutaneous sensory nerves are within normal limits. Right median and ulnar motor nerves are within normal limits.  There is no evidence of active or chronic motor axon loss changes affecting any of the tested muscles.  Motor unit recruitment and activation is within normal limits.  Impression: This is a normal study of the right upper extremity.  In particular, there is no evidence of a right brachial plexopathy or cervical radiculopathy.    ___________________________ Narda Amber, DO    Nerve Conduction Studies   Stim Site NR Peak (ms) Norm Peak (ms) O-P Amp (V) Norm O-P Amp  Right Lat Ante Brach Cutan Anti Sensory (Lat Forearm)  32 C  Lat Biceps    1.9 <2.9 20.9 >14  Right Med Ante Brach Cutan Anti Sensory (Med Forearm)  32 C  Elbow    2.1  12.8   Right Median Anti Sensory (2nd Digit)  32 C  Wrist    2.7 <3.4 50.6 >20  Right Radial Anti Sensory (Base 1st Digit)  32 C  Wrist    2.1 <2.7 37.0 >18  Right Ulnar Anti Sensory (5th Digit)  32 C  Wrist    2.5 <3.1 39.2 >12     Stim Site NR Onset (ms) Norm Onset (ms) O-P Amp (mV) Norm O-P Amp Site1 Site2 Delta-0 (ms) Dist (cm) Vel (m/s) Norm Vel (m/s)  Right Median Motor (Abd Poll Brev)  32 C  Wrist    2.8 <3.9 6.6 >6 Elbow Wrist 4.9 27.0 55 >50  Elbow    7.7  6.3         Right Ulnar Motor (Abd Dig Minimi)  32 C  Wrist    2.4 <3.1 7.5 >7 B Elbow Wrist 3.5 19.0 54 >50  B  Elbow    5.9  7.1  A Elbow B Elbow 1.8 10.0 56 >50  A Elbow    7.7  7.0          Electromyography   Side Muscle Ins.Act Fibs Fasc Recrt Amp Dur Poly Activation Comment  Right 1stDorInt Nml Nml Nml Nml Nml Nml Nml Nml N/A  Right Ext Indicis Nml Nml Nml Nml Nml Nml Nml Nml N/A  Right PronatorTeres Nml Nml Nml Nml Nml Nml Nml Nml N/A  Right Biceps Nml Nml Nml Nml Nml Nml Nml Nml N/A  Right Triceps Nml Nml Nml Nml Nml Nml Nml Nml N/A  Right Deltoid Nml Nml Nml Nml Nml Nml Nml Nml N/A  Right Infraspinatus Nml Nml Nml Nml Nml Nml Nml Nml N/A  Right Cervical Parasp Low Nml Nml Nml Nml Nml Nml Nml Nml N/A      Waveforms:

## 2022-04-10 ENCOUNTER — Encounter: Payer: Self-pay | Admitting: Radiology

## 2022-04-16 ENCOUNTER — Ambulatory Visit: Payer: Medicaid Other | Admitting: Orthopedic Surgery

## 2022-04-23 ENCOUNTER — Encounter: Payer: Medicaid Other | Admitting: Physical Medicine and Rehabilitation

## 2022-10-31 ENCOUNTER — Telehealth: Payer: Medicaid Other | Admitting: Physician Assistant

## 2022-10-31 DIAGNOSIS — B3731 Acute candidiasis of vulva and vagina: Secondary | ICD-10-CM

## 2022-10-31 MED ORDER — FLUCONAZOLE 150 MG PO TABS: 150.0000 mg | ORAL_TABLET | ORAL | 0 refills | Status: DC | PRN

## 2022-10-31 NOTE — Progress Notes (Signed)

## 2022-12-08 ENCOUNTER — Telehealth: Payer: Medicaid Other | Admitting: Physician Assistant

## 2022-12-08 DIAGNOSIS — B3731 Acute candidiasis of vulva and vagina: Secondary | ICD-10-CM

## 2022-12-08 MED ORDER — FLUCONAZOLE 150 MG PO TABS: 150.0000 mg | ORAL_TABLET | ORAL | 0 refills | Status: DC | PRN

## 2022-12-08 NOTE — Progress Notes (Signed)

## 2023-02-13 ENCOUNTER — Telehealth: Payer: Medicaid Other | Admitting: Family Medicine

## 2023-02-13 DIAGNOSIS — B3731 Acute candidiasis of vulva and vagina: Secondary | ICD-10-CM | POA: Diagnosis not present

## 2023-02-13 MED ORDER — FLUCONAZOLE 150 MG PO TABS: 150.0000 mg | ORAL_TABLET | ORAL | 0 refills | Status: DC | PRN

## 2023-02-13 MED ORDER — FLUCONAZOLE 150 MG PO TABS: 150.0000 mg | ORAL_TABLET | Freq: Once | ORAL | 0 refills | Status: AC

## 2023-02-13 NOTE — Progress Notes (Signed)

## 2023-04-01 ENCOUNTER — Telehealth: Payer: Medicaid Other | Admitting: Physician Assistant

## 2023-04-01 DIAGNOSIS — B3731 Acute candidiasis of vulva and vagina: Secondary | ICD-10-CM | POA: Diagnosis not present

## 2023-04-01 MED ORDER — FLUCONAZOLE 150 MG PO TABS: 150.0000 mg | ORAL_TABLET | ORAL | 0 refills | Status: DC | PRN

## 2023-04-01 NOTE — Progress Notes (Signed)
 I have spent 5 minutes in review of e-visit questionnaire, review and updating patient chart, medical decision making and response to patient.   Piedad Climes, PA-C

## 2023-04-01 NOTE — Progress Notes (Signed)

## 2023-04-09 ENCOUNTER — Telehealth: Payer: Medicaid Other | Admitting: Physician Assistant

## 2023-04-09 DIAGNOSIS — R3989 Other symptoms and signs involving the genitourinary system: Secondary | ICD-10-CM

## 2023-04-09 MED ORDER — CEPHALEXIN 500 MG PO CAPS: 500.0000 mg | ORAL_CAPSULE | Freq: Two times a day (BID) | ORAL | 0 refills | Status: AC

## 2023-04-09 NOTE — Progress Notes (Signed)

## 2023-04-09 NOTE — Progress Notes (Signed)
 I have spent 5 minutes in review of e-visit questionnaire, review and updating patient chart, medical decision making and response to patient.   Piedad Climes, PA-C

## 2023-05-27 ENCOUNTER — Telehealth: Admitting: Physician Assistant

## 2023-05-27 DIAGNOSIS — R350 Frequency of micturition: Secondary | ICD-10-CM

## 2023-05-27 DIAGNOSIS — N760 Acute vaginitis: Secondary | ICD-10-CM

## 2023-05-27 NOTE — Progress Notes (Signed)
  Because of having recurrent vaginitis without a recent in-person exam, I feel your condition warrants further evaluation and I recommend that you be seen in a face-to-face visit.   NOTE: There will be NO CHARGE for this E-Visit   If you are having a true medical emergency, please call 911.     For an urgent face to face visit, [place] has multiple urgent care centers for your convenience.  Click the link below for the full list of locations and hours, walk-in wait times, appointment scheduling options and driving directions:  Urgent Care - [place], [place], [place], [place], [place], [place]  [place]     Your MyChart E-visit questionnaire answers were reviewed by a board certified advanced clinical practitioner to complete your personal care plan based on your specific symptoms.    Thank you for using e-Visits.    I have spent 5 minutes in review of e-visit questionnaire, review and updating patient chart, medical decision making and response to patient.   Angelia Kelp, PA-C

## 2023-05-28 ENCOUNTER — Ambulatory Visit
Admission: EM | Admit: 2023-05-28 | Discharge: 2023-05-28 | Disposition: A | Discharge status: Home / Self Care | Attending: Nurse Practitioner | Admitting: Nurse Practitioner

## 2023-05-28 DIAGNOSIS — N898 Other specified noninflammatory disorders of vagina: Secondary | ICD-10-CM | POA: Insufficient documentation

## 2023-05-28 DIAGNOSIS — N760 Acute vaginitis: Secondary | ICD-10-CM | POA: Insufficient documentation

## 2023-05-28 DIAGNOSIS — R399 Unspecified symptoms and signs involving the genitourinary system: Secondary | ICD-10-CM | POA: Insufficient documentation

## 2023-05-28 DIAGNOSIS — Z87440 Personal history of urinary (tract) infections: Secondary | ICD-10-CM | POA: Insufficient documentation

## 2023-05-28 DIAGNOSIS — Z8639 Personal history of other endocrine, nutritional and metabolic disease: Secondary | ICD-10-CM | POA: Diagnosis present

## 2023-05-28 LAB — POCT URINALYSIS DIP (MANUAL ENTRY)
Bilirubin, UA: NEGATIVE
Glucose, UA: 500 mg/dL — AB
Ketones, POC UA: NEGATIVE mg/dL
Leukocytes, UA: NEGATIVE
Nitrite, UA: NEGATIVE
Protein Ur, POC: 100 mg/dL — AB
Spec Grav, UA: 1.02
Urobilinogen, UA: 0.2 U/dL
pH, UA: 6

## 2023-05-28 MED ORDER — FLUCONAZOLE 150 MG PO TABS: ORAL_TABLET | ORAL | 0 refills | Status: DC

## 2023-05-28 NOTE — ED Provider Notes (Signed)
 [facility]    CSN: [id number] Arrival date & time: 05/28/23  1547      History   Chief Complaint No chief complaint on file.   HPI Heather Richard is a 43 y.o. female.   The history is provided by the patient.   Patient presents for complaints of vaginal discharge, vaginal itching, left-sided low back pain, and pain with urination.  Symptoms started over the past 3 to 4 days.  Patient reports that she has a history of recurrent UTIs and yeast infections as she is a type I diabetic.  Patient denies urinary frequency, urgency, hesitancy, decreased urine stream, flank pain, hematuria, or vaginal odor.  Patient declines testing for STI/STD.  Patient states that she is currently waiting to establish care with a new PCP for her diabetes.  She has been taking ibuprofen and Monistat for her symptoms with minimal relief.  LMP 05/11/2023. 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   Type 1 diabetes mellitus with hyperglycemia (HCC) 10/20/2019   Type 1 diabetes (HCC) 05/30/2019    Past Surgical History:  Procedure Laterality Date   OPEN REDUCTION INTERNAL FIXATION (ORIF) DISTAL RADIAL FRACTURE Left 02/09/2020   Procedure: OPEN REDUCTION INTERNAL FIXATION (ORIF) DISTAL RADIAL FRACTURE;  Surgeon: Oliver Barre, MD;  Location: [facility];  Service: Orthopedics;  Laterality: Left;   OPEN REDUCTION INTERNAL FIXATION (ORIF) METACARPAL Left 02/09/2020   Procedure: OPEN REDUCTION INTERNAL FIXATION (ORIF) METACARPAL;  Surgeon: Oliver Barre, MD;  Location: [facility];  Service: Orthopedics;  Laterality: Left;  Left 2nd metacarpal fracture;  possible pinning   TYMPANOSTOMY TUBE PLACEMENT  2011    OB History   No obstetric history on file.      Home Medications    Prior to Admission medications   Medication Sig Start Date End Date Taking? Authorizing Provider  fluconazole (DIFLUCAN) 150 MG tablet Take 1 tablet today.  May repeat every 72 hours as needed up to 2 additional doses. 05/28/23  Yes Leath-Warren, Sadie Haber, NP  ACCU-CHEK GUIDE test strip USE TO TEST GLUCOSE THREE TIME A DAY 01/30/20   [provider]  insulin aspart (NOVOLOG) 100 UNIT/ML injection Inject 18 Units into the skin 3 (three) times daily before meals.    [provider]  insulin glargine (LANTUS) 100 unit/mL SOPN Inject 27 Units into the skin at bedtime.    [provider]  oxymetazoline (AFRIN) 0.05 % nasal spray Place 1 spray into both nostrils 2 (two) times daily.    [provider]  SUBOXONE 8-2 MG FILM Place 1 strip under the tongue 3 (three) times daily. 09/30/21   [provider]    Family History History reviewed. No pertinent family history.  Social History Social History   Tobacco Use   Smoking status: Every Day    Current packs/day: 0.50    Types: Cigarettes   Smokeless tobacco: Never  Substance Use Topics   Alcohol use: No   Drug use: No     Allergies   Gabapentin, Latex, Tomato, Bupropion, Metformin, Pregabalin, and Tylenol [acetaminophen]   Review of Systems Review of Systems Per HPI  Physical Exam Triage Vital Signs ED Triage Vitals  Encounter Vitals Group     BP 05/28/23 1600 (!) 157/91     Systolic BP Percentile --      Diastolic BP Percentile --      Pulse Rate 05/28/23 1600 97     Resp 05/28/23 1600 16     Temp 05/28/23 1600 98.2 F (36.8 C)     Temp Source 05/28/23 1600 Oral     SpO2 05/28/23 1600 95 %     Weight --      Height --      Head Circumference --      Peak Flow --      Pain Score 05/28/23 1601 0     Pain Loc --      Pain Education --       Exclude from Growth Chart --    No data found.  Updated Vital Signs BP (!) 157/91 (BP Location: Right Arm)   Pulse 97   Temp 98.2 F (36.8 C) (Oral)   Resp 16   LMP 05/11/2023   SpO2 95%   Visual Acuity Right Eye Distance:   Left Eye Distance:   Bilateral Distance:    Right Eye Near:   Left Eye Near:    Bilateral Near:     Physical Exam Vitals and nursing note reviewed.  Constitutional:      General: She is not in acute distress.    Appearance: Normal appearance.  HENT:     Head: Normocephalic.  Eyes:     Extraocular Movements: Extraocular movements intact.     Conjunctiva/sclera: Conjunctivae normal.     Pupils: Pupils are equal, round, and reactive to light.  Cardiovascular:     Rate and Rhythm: Normal rate and regular rhythm.     Pulses: Normal pulses.     Heart sounds: Normal heart sounds.  Pulmonary:     Effort: Pulmonary effort is normal.     Breath sounds: Normal breath sounds.  Abdominal:     General: Bowel sounds are normal.     Palpations: Abdomen is soft.     Tenderness: There is no abdominal tenderness. There is no right CVA tenderness or left CVA tenderness.  Genitourinary:    Comments: GU exam deferred, self swab performed  Musculoskeletal:     Cervical back: Normal range of motion.  Skin:    General: Skin is warm and dry.  Neurological:     General: No focal deficit present.     Mental Status: She is alert and oriented to person, place, and time.  Psychiatric:        Mood and Affect: Mood normal.        Behavior: Behavior normal.      UC Treatments / Results  Labs (all labs ordered are listed, but only abnormal results are displayed) Labs Reviewed  POCT URINALYSIS DIP (MANUAL ENTRY) - Abnormal; Notable for the following components:      Result Value   Glucose, UA =500 (*)    Blood, UA trace-intact (*)    Protein Ur, POC =100 (*)    All other components within normal limits  URINE CULTURE  CERVICOVAGINAL ANCILLARY ONLY     EKG   Radiology No results found.  Procedures Procedures (including critical care time)  Medications Ordered in UC Medications - No data to display  Initial Impression / Assessment and Plan / UC Course  I have reviewed the triage vital signs and the nursing notes.  Pertinent labs & imaging results that were available during my care of the patient were reviewed by me and considered in my medical decision making (see chart for details).  Urinalysis does not indicate an obvious urinary tract infection, urine culture is pending along with a cytology swab.  In the interim, will treat for probable yeast infection with fluconazole 150 mg.  Supportive care recommendations were provided and discussed with the patient to include fluids, rest, avoiding caffeine, developing a toileting schedule, and controlling her blood glucose levels to prevent reoccurring yeast infections.  Patient was advised she will be contacted if the pending test results are abnormal.  Patient advised to follow-up with PCP as scheduled to establish care.  Patient was in agreement with this plan of care and verbalizes understanding.  All questions were answered.  Patient stable for discharge.   Final Clinical Impressions(s) / UC Diagnoses   Final diagnoses:  Vaginal discharge  UTI symptoms  Acute vaginitis  History of diabetes mellitus  History of recurrent UTIs     Discharge Instructions      Urine culture and cytology swab are pending.  You will be contacted if the pending test results are abnormal.  You will also have access to the results via MyChart. Increase fluids and allow for plenty of rest. Recommend developing toileting schedule that will allow you to urinate at least every 2 hours. Avoid caffeine such as tea, soda, and coffee while symptoms persist. Monitor your blood glucose levels closely to prevent reoccurring yeast infections. Recommend eating yogurt daily to help maintain vaginal  flora. Follow-up with PCP as scheduled. Follow-up as needed.     ED Prescriptions     Medication Sig Dispense Auth. Provider   fluconazole (DIFLUCAN) 150 MG tablet Take 1 tablet today.  May repeat every 72 hours as needed up to 2 additional doses. 3 tablet Leath-Warren, Belen Bowers, NP      PDMP not reviewed this encounter.   Hardy Lia, NP 05/28/23 [phone]

## 2023-05-28 NOTE — ED Triage Notes (Signed)
 Pt reports she has some vaginal itchy, white discharge, pressure after urination, left side lower back pain x 4 days    Took ibuprofen and monistat but no relief

## 2023-05-28 NOTE — Discharge Instructions (Signed)
 Urine culture and cytology swab are pending.  You will be contacted if the pending test results are abnormal.  You will also have access to the results via MyChart. Increase fluids and allow for plenty of rest. Recommend developing toileting schedule that will allow you to urinate at least every 2 hours. Avoid caffeine such as tea, soda, and coffee while symptoms persist. Monitor your blood glucose levels closely to prevent reoccurring yeast infections. Recommend eating yogurt daily to help maintain vaginal flora. Follow-up with PCP as scheduled. Follow-up as needed.

## 2023-05-29 LAB — CERVICOVAGINAL ANCILLARY ONLY
Bacterial Vaginitis (gardnerella): NEGATIVE
Candida Glabrata: POSITIVE — AB
Candida Vaginitis: POSITIVE — AB
Comment: NEGATIVE
Comment: NEGATIVE
Comment: NEGATIVE

## 2023-05-30 LAB — URINE CULTURE: Culture: NO GROWTH

## 2023-06-02 LAB — BASIC METABOLIC PANEL WITH GFR
BUN: 12 (ref 4–21)
Creatinine: 0.7 (ref 0.5–1.1)
Glucose: 214

## 2023-06-02 LAB — HEMOGLOBIN A1C: Hemoglobin A1C: 15.5

## 2023-06-02 LAB — LIPID PANEL
LDL Cholesterol: 166
Triglycerides: 220 — AB (ref 40–160)

## 2023-06-02 LAB — TSH: TSH: 0.49 (ref 0.41–5.90)

## 2023-06-02 LAB — COMPREHENSIVE METABOLIC PANEL WITH GFR: eGFR: 110

## 2023-06-30 ENCOUNTER — Other Ambulatory Visit (HOSPITAL_COMMUNITY): Payer: Self-pay | Admitting: Nurse Practitioner

## 2023-06-30 DIAGNOSIS — N921 Excessive and frequent menstruation with irregular cycle: Secondary | ICD-10-CM

## 2023-06-30 DIAGNOSIS — Z1231 Encounter for screening mammogram for malignant neoplasm of breast: Secondary | ICD-10-CM

## 2023-07-08 ENCOUNTER — Other Ambulatory Visit: Payer: Self-pay | Admitting: Medical Genetics

## 2023-07-09 ENCOUNTER — Ambulatory Visit (HOSPITAL_COMMUNITY)
Admission: RE | Admit: 2023-07-09 | Discharge: 2023-07-09 | Disposition: A | Discharge status: Home / Self Care | Source: Ambulatory Visit | Attending: Nurse Practitioner | Admitting: Nurse Practitioner

## 2023-07-09 DIAGNOSIS — N921 Excessive and frequent menstruation with irregular cycle: Secondary | ICD-10-CM | POA: Diagnosis present

## 2023-07-10 NOTE — Patient Instructions (Signed)

## 2023-07-13 ENCOUNTER — Ambulatory Visit: Admitting: Nurse Practitioner

## 2023-07-13 ENCOUNTER — Encounter: Payer: Self-pay | Admitting: Nurse Practitioner

## 2023-07-13 VITALS — BP 124/76 | HR 91 | Ht 64.0 in | Wt 170.8 lb

## 2023-07-13 DIAGNOSIS — Z794 Long term (current) use of insulin: Secondary | ICD-10-CM | POA: Diagnosis not present

## 2023-07-13 DIAGNOSIS — E782 Mixed hyperlipidemia: Secondary | ICD-10-CM

## 2023-07-13 DIAGNOSIS — I10 Essential (primary) hypertension: Secondary | ICD-10-CM | POA: Diagnosis not present

## 2023-07-13 DIAGNOSIS — E1065 Type 1 diabetes mellitus with hyperglycemia: Secondary | ICD-10-CM

## 2023-07-13 MED ORDER — LANTUS SOLOSTAR 100 UNIT/ML ~~LOC~~ SOPN: 20.0000 [IU] | PEN_INJECTOR | Freq: Every day | SUBCUTANEOUS | 3 refills | Status: AC

## 2023-07-13 MED ORDER — ACCU-CHEK GUIDE ME W/DEVICE KIT: PACK | 0 refills | Status: AC

## 2023-07-13 MED ORDER — PEN NEEDLES 31G X 6 MM MISC: 3 refills | Status: AC

## 2023-07-13 MED ORDER — NOVOLOG FLEXPEN 100 UNIT/ML ~~LOC~~ SOPN: 5.0000 [IU] | PEN_INJECTOR | Freq: Three times a day (TID) | SUBCUTANEOUS | 3 refills | Status: AC

## 2023-07-13 MED ORDER — ACCU-CHEK GUIDE TEST VI STRP: ORAL_STRIP | 11 refills | Status: AC

## 2023-07-13 MED ORDER — DEXCOM G7 SENSOR MISC: 1.0000 | 3 refills | Status: AC

## 2023-07-13 MED ORDER — ACCU-CHEK SOFTCLIX LANCETS MISC: 12 refills | Status: AC

## 2023-07-13 NOTE — Progress Notes (Signed)
 Endocrinology Consult Note       07/13/2023, 11:00 AM   Subjective:    Patient ID: Heather Richard, female    DOB: [birth year].  Heather Richard is being seen in consultation for management of currently uncontrolled symptomatic diabetes requested by  Bolivar Bushman., PA-C.   Past Medical History:  Diagnosis Date   Diabetes mellitus without complication (HCC)    Hyperlipidemia     Past Surgical History:  Procedure Laterality Date   OPEN REDUCTION INTERNAL FIXATION (ORIF) DISTAL RADIAL FRACTURE Left 02/09/2020   Procedure: OPEN REDUCTION INTERNAL FIXATION (ORIF) DISTAL RADIAL FRACTURE;  Surgeon: Tonita Frater, MD;  Location: [facility];  Service: Orthopedics;  Laterality: Left;   OPEN REDUCTION INTERNAL FIXATION (ORIF) METACARPAL Left 02/09/2020   Procedure: OPEN REDUCTION INTERNAL FIXATION (ORIF) METACARPAL;  Surgeon: Tonita Frater, MD;  Location: [facility];  Service: Orthopedics;  Laterality: Left;  Left 2nd metacarpal fracture; possible pinning   TYMPANOSTOMY TUBE PLACEMENT  2011    Social History   Socioeconomic History   Marital status: Married    Spouse name: Not on file   Number of children: Not on file   Years of education: Not on file   Highest education level: Not on file  Occupational History   Not on file  Tobacco Use   Smoking status: Every Day    Types: E-cigarettes   Smokeless tobacco: Never   Tobacco comments:    Patient states that she stopped cigarettes 3 years ado , she is currently vaping.  Substance and Sexual Activity   Alcohol use: No   Drug use: No   Sexual activity: Yes  Other Topics Concern   Not on file  Social History Narrative   ** Merged History Encounter **       Social Drivers of Corporate investment banker Strain: Not on file  Food Insecurity: Not on file  Transportation Needs: Not on file  Physical Activity: Not on file  Stress: Not on file  Social Connections: Not on  file    History reviewed. No pertinent family history.  Outpatient Encounter Medications as of 07/13/2023  Medication Sig   Accu-Chek Softclix Lancets lancets Use as instructed to monitor glucose 4-6 times daily   Blood Glucose Monitoring Suppl (ACCU-CHEK GUIDE ME) w/Device KIT Use to check glucose 4-6 times daily   Continuous Glucose Sensor (DEXCOM G7 SENSOR) MISC Inject 1 Application into the skin as directed. Change sensor every 10 days as directed.   glucose blood (ACCU-CHEK GUIDE TEST) test strip Use as instructed to monitor glucose 4-6 times daily   insulin  aspart (NOVOLOG  FLEXPEN) 100 UNIT/ML FlexPen Inject 5-11 Units into the skin 3 (three) times daily with meals.   insulin  glargine (LANTUS SOLOSTAR) 100 UNIT/ML Solostar Pen Inject 20 Units into the skin at bedtime.   Insulin  Pen Needle (PEN NEEDLES) 31G X 6 MM MISC Use to inject insulin  4 times daily   oxymetazoline (AFRIN) 0.05 % nasal spray Place 1 spray into both nostrils 2 (two) times daily.   SUBOXONE 8-2 MG FILM Place 1 strip under the tongue 3 (three) times daily.   [DISCONTINUED] insulin  aspart (NOVOLOG ) 100 UNIT/ML injection Inject 18  Units into the skin 3 (three) times daily before meals.   atorvastatin (LIPITOR) 10 MG tablet Take by mouth daily. Patient has not yet. (Patient not taking: Reported on 07/13/2023)   fluconazole  (DIFLUCAN ) 150 MG tablet Take 1 tablet today.  May repeat every 72 hours as needed up to 2 additional doses. (Patient not taking: Reported on 07/13/2023)   olmesartan (BENICAR) 20 MG tablet Take 20 mg by mouth every morning. (Patient not taking: Reported on 07/13/2023)   progesterone (PROMETRIUM) 200 MG capsule Take 200 mg by mouth daily. (Patient not taking: Reported on 07/13/2023)   [DISCONTINUED] ACCU-CHEK GUIDE test strip USE TO TEST GLUCOSE THREE TIME A DAY (Patient not taking: Reported on 07/13/2023)   [DISCONTINUED] insulin  glargine (LANTUS) 100 unit/mL SOPN Inject 27 Units into the skin at bedtime. (Patient  not taking: Reported on 07/13/2023)   No facility-administered encounter medications on file as of 07/13/2023.    ALLERGIES: Allergies  Allergen Reactions   Gabapentin  Swelling    Facila sweling, shortness of breath requiring benedryl.   Latex Rash    "it breaks me out"   Tomato Anaphylaxis, Nausea And Vomiting and Swelling   Bupropion    Metformin  Diarrhea   Pregabalin     Tylenol  [Acetaminophen ] Hives and Rash    VACCINATION STATUS: Immunization History  Administered Date(s) Administered   Tdap 01/19/2020    Diabetes She presents for her initial diabetic visit. She has type 1 diabetes mellitus. Onset time: diagnosed at approx age of 26, had GD prior to that. Her disease course has been worsening. Associated symptoms include blurred vision, fatigue, polydipsia and polyuria. There are no hypoglycemic complications. Symptoms are stable. There are no diabetic complications. (Did have 1 episode of DKA back in 2020-2021) Risk factors for coronary artery disease include diabetes mellitus, dyslipidemia, family history, hypertension and tobacco exposure. Current diabetic treatment includes intensive insulin  program. She is compliant with treatment some of the time (had been out of medication for over a year). Her weight is fluctuating minimally. She is following a generally healthy diet. Meal planning includes avoidance of concentrated sweets, carbohydrate counting and ADA exchanges. She has not had a previous visit with a dietitian. She participates in exercise intermittently. Her overall blood glucose range is >200 mg/dl. (She presents today for her consultation with no meter or logs to review.  Her most recent A1c on 4/22 was 15.5%.  She notes she has not been on insulin  for over a year, has used some of her sons insulin  (he has type 1 diabetes) to help when glucose is 500-600 range.  She has never been on pump in the past.  She drinks Propel water and coffee with a splash of creamer.  She generally  only eats 1 meal per day, avoiding snacks.  She does engage in routine physical activity with walking.  She is UTD on eye exam, has never seen podiatry in the past.) An ACE inhibitor/angiotensin II receptor blocker is not being taken (was recently prescribed one but wanted to make sure with us  first). She does not see a podiatrist.Eye exam is current.     Review of systems  Constitutional: + Minimally fluctuating body weight, current Body mass index is 29.32 kg/m., + fatigue, no subjective hyperthermia, no subjective hypothermia, + polydipsia Eyes: no blurry vision, no xerophthalmia ENT: no sore throat, no nodules palpated in throat, no dysphagia/odynophagia, no hoarseness Cardiovascular: no chest pain, no shortness of breath, no palpitations, no leg swelling Respiratory: no cough, no shortness of breath  Gastrointestinal: no nausea/vomiting/diarrhea Genitourinary: + polyuria Musculoskeletal: no muscle/joint aches Skin: no rashes, no hyperemia Neurological: no tremors, no numbness, no tingling, no dizziness Psychiatric: no depression, no anxiety  Objective:      BP 124/76 (BP Location: Left Arm, Patient Position: Sitting, Cuff Size: Large)   Pulse 91   Ht 5\' 4"  (1.626 m)   Wt 170 lb 12.8 oz (77.5 kg)   LMP 06/29/2023 (Approximate)   SpO2 97%   BMI 29.32 kg/m   Wt Readings from Last 3 Encounters:  07/13/23 170 lb 12.8 oz (77.5 kg)  03/11/22 140 lb (63.5 kg)  01/14/22 140 lb (63.5 kg)     BP Readings from Last 3 Encounters:  07/13/23 124/76  05/28/23 (!) 157/91  03/11/22 125/85     Physical Exam- Limited  Constitutional:  Body mass index is 29.32 kg/m. , not in acute distress, normal state of mind Eyes:  EOMI, no exophthalmos Neck: Supple Cardiovascular: RRR, no murmurs, rubs, or gallops, no edema Respiratory: Adequate breathing efforts, no crackles, rales, rhonchi, or wheezing Musculoskeletal: no gross deformities, strength intact in all four extremities, no gross  restriction of joint movements Skin:  no rashes, no hyperemia Neurological: no tremor with outstretched hands   Diabetic Foot Exam - Simple   No data filed      CMP ( most recent) CMP     Component Value Date/Time   NA 136 11/15/2021 0729   K 3.7 11/15/2021 0729   CL 102 11/15/2021 0729   CO2 21 (L) 11/15/2021 0729   GLUCOSE 310 (H) 11/15/2021 0729   BUN 12 06/02/2023 0000   CREATININE 0.7 06/02/2023 0000   CREATININE 0.44 11/15/2021 0729   CALCIUM 8.9 11/15/2021 0729   PROT 6.7 01/19/2020 2022   ALBUMIN 3.5 01/19/2020 2022   AST 36 01/19/2020 2022   ALT 25 01/19/2020 2022   ALKPHOS 64 01/19/2020 2022   BILITOT 0.4 01/19/2020 2022   EGFR 110 06/02/2023 0000   GFRNONAA >60 11/15/2021 0729     Diabetic Labs (most recent): Lab Results  Component Value Date   HGBA1C 15.5 06/02/2023   HGBA1C 11.1 (H) 02/07/2020     Lipid Panel ( most recent) Lipid Panel     Component Value Date/Time   TRIG 220 (A) 06/02/2023 0000   LDLCALC 166 06/02/2023 0000      Lab Results  Component Value Date   TSH 0.49 06/02/2023           Assessment & Plan:   1) Type 1 diabetes mellitus with hyperglycemia (HCC) (Primary)  She presents today for her consultation with no meter or logs to review.  Her most recent A1c on 4/22 was 15.5%.  She notes she has not been on insulin  for over a year, has used some of her sons insulin  (he has type 1 diabetes) to help when glucose is 500-600 range.  She has never been on pump in the past.  She drinks Propel water and coffee with a splash of creamer.  She generally only eats 1 meal per day, avoiding snacks.  She does engage in routine physical activity with walking.  She is UTD on eye exam, has never seen podiatry in the past.  - Heather Richard has currently uncontrolled symptomatic type 1 DM since 43 years of age, with most recent A1c of 15.5 %.   -Recent labs reviewed.  - I had a long discussion with her about the progressive nature of diabetes  and the pathology behind its  complications. -her diabetes is complicated by ?compliance and she remains at a high risk for more acute and chronic complications which include CAD, CVA, CKD, retinopathy, and neuropathy. These are all discussed in detail with her.  The following Lifestyle Medicine recommendations according to American College of Lifestyle Medicine [facility]) were discussed and offered to patient and she agrees to start the journey:  A. Whole Foods, Plant-based plate comprising of fruits and vegetables, plant-based proteins, whole-grain carbohydrates was discussed in detail with the patient.   A list for source of those nutrients were also provided to the patient.  Patient will use only water or unsweetened tea for hydration. B.  The need to stay away from risky substances including alcohol, smoking; obtaining 7 to 9 hours of restorative sleep, at least 150 minutes of moderate intensity exercise weekly, the importance of healthy social connections,  and stress reduction techniques were discussed. C.  A full color page of  Calorie density of various food groups per pound showing examples of each food groups was provided to the patient.  - I have counseled her on diet and weight management by adopting a carbohydrate restricted/protein rich diet. Patient is encouraged to switch to unprocessed or minimally processed complex starch and increased protein intake (animal or plant source), fruits, and vegetables. -  she is advised to stick to a routine mealtimes to eat 3 meals a day and avoid unnecessary snacks (to snack only to correct hypoglycemia).   - she acknowledges that there is a room for improvement in her food and drink choices. - Suggestion is made for her to avoid simple carbohydrates from her diet including Cakes, Sweet Desserts, Ice Cream, Soda (diet and regular), Sweet Tea, Candies, Chips, Cookies, Store Bought Juices, Alcohol in Excess of 1-2 drinks a day, Artificial Sweeteners, Coffee  Creamer, and "Sugar-free" Products. This will help patient to have more stable blood glucose profile and potentially avoid unintended weight gain.  - I have approached her with the following individualized plan to manage her diabetes and patient agrees:   -Will initiate Lantus 20 units SQ nightly and adjust her Novolog  to 5-11 units TID with meals if glucose is above 70 and she is eating (Specific instructions on how to titrate insulin  dosage based on glucose readings given to patient in writing.).  She demonstrated her ability to properly use the SSI chart with me today.  I do anticipate she will need adjustment early in, she is advised to reach out in 2 weeks.  -she is encouraged to start monitoring glucose 4 times daily, before meals and before bed, and to call the clinic if she has readings less than 70 or above 300 for 3 tests in a row.  She could benefit from CGM device given treatment with MDI.  I sent in for Dexcom G7 to her pharmacy.  -She may be interested in a pump moving forward.  - she is warned not to take insulin  without proper monitoring per orders. - Adjustment parameters are given to her for hypo and hyperglycemia in writing.  -She is not a candidate for non-insulin  therapies given type 1 diagnosis.  - Specific targets for  A1c; LDL, HDL, and Triglycerides were discussed with the patient.  2) Blood Pressure /Hypertension:  her blood pressure is controlled to target.   she is advised to start her Benicar 20 mg p.o. daily with breakfast.  3) Lipids/Hyperlipidemia:    Review of her recent lipid panel from 06/02/23 showed uncontrolled LDL at 166 and elevated  triglycerides of 220 .  she is advised to start her Atorvastatin 10 mg daily at bedtime.  Side effects and precautions discussed with her.  4)  Weight/Diet:  her Body mass index is 29.32 kg/m.  -  she is NOT a candidate for weight loss.  Exercise, and detailed carbohydrates information provided  -  detailed on discharge  instructions.  5) Chronic Care/Health Maintenance: -she is on ACEI/ARB and Statin medications and is encouraged to initiate and continue to follow up with Ophthalmology, Dentist, Podiatrist at least yearly or according to recommendations, and advised to stay away from smoking. I have recommended yearly flu vaccine and pneumonia vaccine at least every 5 years; moderate intensity exercise for up to 150 minutes weekly; and sleep for at least 7 hours a day.  - she is advised to maintain close follow up with Muse, Paz Bott., PA-C for primary care needs, as well as her other providers for optimal and coordinated care.   - Time spent in this patient care: 60 min, which was spent in counseling her about her diabetes and the rest reviewing her blood glucose logs, discussing her hypoglycemia and hyperglycemia episodes, reviewing her current and previous labs/studies (including abstraction from other facilities) and medications doses and developing a long term treatment plan based on the latest standards of care/guidelines; and documenting her care.    Please refer to Patient Instructions for Blood Glucose Monitoring and Insulin /Medications Dosing Guide" in media tab for additional information. Please also refer to "Patient Self Inventory" in the Media tab for reviewed elements of pertinent patient history.  Heather Richard participated in the discussions, expressed understanding, and voiced agreement with the above plans.  All questions were answered to her satisfaction. she is encouraged to contact clinic should she have any questions or concerns prior to her return visit.     Follow up plan: - Return in about 4 months (around 11/12/2023) for Diabetes F/U with A1c in office, No previsit labs, Bring meter and logs.    Hulon Magic, [facility] [address] [place], [place] [address] Phone: [phone] Fax: [phone]  07/13/2023, 11:00 AM

## 2023-07-15 ENCOUNTER — Other Ambulatory Visit (HOSPITAL_COMMUNITY): Payer: Self-pay | Admitting: Physician Assistant

## 2023-07-15 ENCOUNTER — Encounter (HOSPITAL_COMMUNITY): Payer: Self-pay

## 2023-07-15 ENCOUNTER — Other Ambulatory Visit (HOSPITAL_COMMUNITY)
Admission: RE | Admit: 2023-07-15 | Discharge: 2023-07-15 | Disposition: A | Discharge status: Home / Self Care | Source: Ambulatory Visit | Attending: Oncology | Admitting: Oncology

## 2023-07-15 ENCOUNTER — Ambulatory Visit (HOSPITAL_COMMUNITY)
Admission: RE | Admit: 2023-07-15 | Discharge: 2023-07-15 | Disposition: A | Discharge status: Home / Self Care | Source: Ambulatory Visit | Attending: Nurse Practitioner | Admitting: Nurse Practitioner

## 2023-07-15 DIAGNOSIS — N63 Unspecified lump in unspecified breast: Secondary | ICD-10-CM

## 2023-07-15 DIAGNOSIS — Z1231 Encounter for screening mammogram for malignant neoplasm of breast: Secondary | ICD-10-CM | POA: Insufficient documentation

## 2023-07-18 LAB — LAB REPORT - SCANNED: EGFR: 113

## 2023-07-20 ENCOUNTER — Telehealth: Payer: Self-pay | Admitting: Pharmacy Technician

## 2023-07-20 ENCOUNTER — Other Ambulatory Visit (HOSPITAL_COMMUNITY): Payer: Self-pay

## 2023-07-20 NOTE — Telephone Encounter (Signed)
 Pharmacy Patient Advocate Encounter   Received notification from CoverMyMeds that prior authorization for Dexcom G7 Sensor is required/requested.   Insurance verification completed.   The patient is insured through [facility] .   Per test claim: PA required; PA submitted to above mentioned insurance via CoverMyMeds Key/confirmation #/EOC [id number] Status is pending

## 2023-07-20 NOTE — Telephone Encounter (Signed)
 Pharmacy Patient Advocate Encounter  Received notification from [facility] that Prior Authorization for Dexcom G7 Sensor has been APPROVED from 07/20/2023 to 01/16/2024. Ran test claim, Copay is $0.00. This test claim was processed through [facility]- copay amounts may vary at other pharmacies due to pharmacy/plan contracts, or as the patient moves through the different stages of their insurance plan.   PA #/Case ID/Reference #: [id number]

## 2023-07-21 LAB — GENECONNECT MOLECULAR SCREEN: Genetic Analysis Overall Interpretation: NEGATIVE

## 2023-08-04 ENCOUNTER — Ambulatory Visit (HOSPITAL_COMMUNITY)
Admission: RE | Admit: 2023-08-04 | Discharge: 2023-08-04 | Disposition: A | Discharge status: Home / Self Care | Source: Ambulatory Visit | Attending: Physician Assistant | Admitting: Physician Assistant

## 2023-08-04 ENCOUNTER — Encounter (HOSPITAL_COMMUNITY): Payer: Self-pay

## 2023-08-04 DIAGNOSIS — N63 Unspecified lump in unspecified breast: Secondary | ICD-10-CM

## 2023-09-15 ENCOUNTER — Ambulatory Visit: Admitting: Nurse Practitioner

## 2023-10-02 ENCOUNTER — Telehealth: Admitting: Physician Assistant

## 2023-10-02 DIAGNOSIS — N760 Acute vaginitis: Secondary | ICD-10-CM

## 2023-10-02 MED ORDER — FLUCONAZOLE 150 MG PO TABS
150.0000 mg | ORAL_TABLET | ORAL | 0 refills | Status: DC | PRN
Start: 2023-10-02 — End: 2023-11-13

## 2023-10-02 NOTE — Progress Notes (Signed)

## 2023-10-08 ENCOUNTER — Other Ambulatory Visit (HOSPITAL_COMMUNITY): Payer: Self-pay | Admitting: Nurse Practitioner

## 2023-10-08 DIAGNOSIS — R102 Pelvic and perineal pain: Secondary | ICD-10-CM

## 2023-10-15 ENCOUNTER — Ambulatory Visit (HOSPITAL_COMMUNITY): Admission: RE | Admit: 2023-10-15 | Source: Ambulatory Visit

## 2023-10-15 ENCOUNTER — Encounter (HOSPITAL_COMMUNITY): Payer: Self-pay

## 2023-11-13 ENCOUNTER — Ambulatory Visit (INDEPENDENT_AMBULATORY_CARE_PROVIDER_SITE_OTHER): Admitting: Nurse Practitioner

## 2023-11-13 ENCOUNTER — Encounter: Payer: Self-pay | Admitting: Nurse Practitioner

## 2023-11-13 VITALS — BP 138/88 | HR 85 | Ht 64.0 in | Wt 194.4 lb

## 2023-11-13 DIAGNOSIS — Z8349 Family history of other endocrine, nutritional and metabolic diseases: Secondary | ICD-10-CM

## 2023-11-13 DIAGNOSIS — R5382 Chronic fatigue, unspecified: Secondary | ICD-10-CM | POA: Diagnosis not present

## 2023-11-13 DIAGNOSIS — E1065 Type 1 diabetes mellitus with hyperglycemia: Secondary | ICD-10-CM

## 2023-11-13 LAB — POCT GLYCOSYLATED HEMOGLOBIN (HGB A1C): HbA1c, POC (controlled diabetic range): 11.5 % — AB (ref 0.0–7.0)

## 2023-11-13 NOTE — Progress Notes (Signed)
 Endocrinology Follow Up Note       11/13/2023, 8:40 AM   Subjective:    Patient ID: Heather Richard, female    DOB: 1980/02/20.  Heather Richard is being seen in follow up after being seen in consultation for management of currently uncontrolled symptomatic diabetes requested by  Catharine Ethelene BIRCH., PA-C.   Past Medical History:  Diagnosis Date   Diabetes mellitus without complication (HCC)    Hyperlipidemia     Past Surgical History:  Procedure Laterality Date   OPEN REDUCTION INTERNAL FIXATION (ORIF) DISTAL RADIAL FRACTURE Left 02/09/2020   Procedure: OPEN REDUCTION INTERNAL FIXATION (ORIF) DISTAL RADIAL FRACTURE;  Surgeon: Onesimo Oneil LABOR, MD;  Location: [facility];  Service: Orthopedics;  Laterality: Left;   OPEN REDUCTION INTERNAL FIXATION (ORIF) METACARPAL Left 02/09/2020   Procedure: OPEN REDUCTION INTERNAL FIXATION (ORIF) METACARPAL;  Surgeon: Onesimo Oneil LABOR, MD;  Location: [facility];  Service: Orthopedics;  Laterality: Left;  Left 2nd metacarpal fracture; possible pinning   TYMPANOSTOMY TUBE PLACEMENT  2011    Social History   Socioeconomic History   Marital status: Married    Spouse name: Not on file   Number of children: Not on file   Years of education: Not on file   Highest education level: Not on file  Occupational History   Not on file  Tobacco Use   Smoking status: Every Day    Types: E-cigarettes   Smokeless tobacco: Never   Tobacco comments:    Patient states that she stopped cigarettes 3 years ado , she is currently vaping.  Substance and Sexual Activity   Alcohol use: No   Drug use: No   Sexual activity: Yes  Other Topics Concern   Not on file  Social History Narrative   ** Merged History Encounter **       Social Drivers of Corporate investment banker Strain: Not on file  Food Insecurity: Not on file  Transportation Needs: Not on file  Physical Activity: Not on file  Stress: Not on  file  Social Connections: Not on file    Family History  Problem Relation Age of Onset   Breast cancer Mother    Breast cancer Sister    Breast cancer Maternal Grandmother    Breast cancer Cousin     Outpatient Encounter Medications as of 11/13/2023  Medication Sig   Accu-Chek Softclix Lancets lancets Use as instructed to monitor glucose 4-6 times daily   Blood Glucose Monitoring Suppl (ACCU-CHEK GUIDE ME) w/Device KIT Use to check glucose 4-6 times daily   Continuous Glucose Sensor (DEXCOM G7 SENSOR) MISC Inject 1 Application into the skin as directed. Change sensor every 10 days as directed.   glucose blood (ACCU-CHEK GUIDE TEST) test strip Use as instructed to monitor glucose 4-6 times daily   insulin  aspart (NOVOLOG  FLEXPEN) 100 UNIT/ML FlexPen Inject 5-11 Units into the skin 3 (three) times daily with meals.   insulin  glargine (LANTUS  SOLOSTAR) 100 UNIT/ML Solostar Pen Inject 20 Units into the skin at bedtime.   Insulin  Pen Needle (PEN NEEDLES) 31G X 6 MM MISC Use to inject insulin  4 times daily   oxymetazoline (AFRIN) 0.05 % nasal spray Place  1 spray into both nostrils 2 (two) times daily.   SUBOXONE 8-2 MG FILM Place 1 strip under the tongue 3 (three) times daily.   olmesartan (BENICAR) 20 MG tablet Take 20 mg by mouth every morning. (Patient not taking: Reported on 11/13/2023)   progesterone (PROMETRIUM) 200 MG capsule Take 200 mg by mouth daily. (Patient not taking: Reported on 07/13/2023)   [DISCONTINUED] atorvastatin (LIPITOR) 10 MG tablet Take by mouth daily. Patient has not yet. (Patient not taking: Reported on 11/13/2023)   [DISCONTINUED] fluconazole  (DIFLUCAN ) 150 MG tablet Take 1 tablet today.  May repeat every 72 hours as needed up to 2 additional doses. (Patient not taking: Reported on 11/13/2023)   [DISCONTINUED] fluconazole  (DIFLUCAN ) 150 MG tablet Take 1 tablet (150 mg total) by mouth every three (3) days as needed for up to 3 doses. Take one tablet on the day you fill the  prescription. If you continue to have symptoms then take the second tablet in 3 days. (Patient not taking: Reported on 11/13/2023)   No facility-administered encounter medications on file as of 11/13/2023.    ALLERGIES: Allergies  Allergen Reactions   Gabapentin  Swelling    Facila sweling, shortness of breath requiring benedryl.   Latex Rash    it breaks me out   Tomato Anaphylaxis, Nausea And Vomiting and Swelling   Bupropion    Metformin  Diarrhea   Pregabalin     Tylenol  [Acetaminophen ] Hives and Rash    VACCINATION STATUS: Immunization History  Administered Date(s) Administered   Tdap 01/19/2020    Diabetes She presents for her follow-up diabetic visit. She has type 1 diabetes mellitus. Onset time: diagnosed at approx age of 20, had GD prior to that. Her disease course has been improving. Associated symptoms include blurred vision, fatigue, polydipsia and polyuria. There are no hypoglycemic complications. Symptoms are stable. There are no diabetic complications. (Did have 1 episode of DKA back in 2020-2021) Risk factors for coronary artery disease include diabetes mellitus, dyslipidemia, family history, hypertension and tobacco exposure. Current diabetic treatment includes intensive insulin  program. She is compliant with treatment most of the time. Her weight is fluctuating minimally. She is following a generally healthy diet. Meal planning includes avoidance of concentrated sweets, carbohydrate counting and ADA exchanges. She has not had a previous visit with a dietitian. She participates in exercise intermittently. Her home blood glucose trend is fluctuating minimally. Her overall blood glucose range is >200 mg/dl. (She presents today with her meter showing inconsistent glucose monitoring and gross hyperglycemia overall.  She was unable to get the Dexcom sensors to stick properly.  Her POCT A1c today is 11.5%, improving from last visit of 15.5%.  She has not missed any insulin  doses.   She does report she doesn't eat much throughout the day as she sleeps the majority of it.  She notes her fatigue is profound and her OBGYN is blaming it on her thyroid, even though it was not checked recently.  Analysis of her meter shows 7-day average of 332 with 15 readings, 14-day average of 333 with 21 readings; 30-day average of 318 with 41 readings, 90-day average of 333 with 85 readings.) An ACE inhibitor/angiotensin II receptor blocker is not being taken (was recently prescribed one but wanted to make sure with us  first). She does not see a podiatrist.Eye exam is current.     Review of systems  Constitutional: + increasing body weight, current Body mass index is 33.37 kg/m., + fatigue, no subjective hyperthermia, no subjective hypothermia, + polydipsia  Eyes: no blurry vision, no xerophthalmia ENT: no sore throat, no nodules palpated in throat, no dysphagia/odynophagia, no hoarseness Cardiovascular: no chest pain, no shortness of breath, no palpitations, no leg swelling Respiratory: no cough, no shortness of breath Gastrointestinal: no nausea/vomiting/diarrhea Genitourinary: + polyuria, no sex drive Musculoskeletal: no muscle/joint aches Skin: no rashes, no hyperemia Neurological: no tremors, no numbness, no tingling, no dizziness Psychiatric: no depression, no anxiety  Objective:      BP 138/88 (BP Location: Left Arm, Patient Position: Sitting, Cuff Size: Large)   Pulse 85   Ht 5' 4 (1.626 m)   Wt 194 lb 6.4 oz (88.2 kg)   BMI 33.37 kg/m   Wt Readings from Last 3 Encounters:  11/13/23 194 lb 6.4 oz (88.2 kg)  07/13/23 170 lb 12.8 oz (77.5 kg)  03/11/22 140 lb (63.5 kg)     BP Readings from Last 3 Encounters:  11/13/23 138/88  07/13/23 124/76  05/28/23 (!) 157/91     Physical Exam- Limited  Constitutional:  Body mass index is 33.37 kg/m. , not in acute distress, normal state of mind Eyes:  EOMI, no exophthalmos Musculoskeletal: no gross deformities, strength  intact in all four extremities, no gross restriction of joint movements Skin:  no rashes, no hyperemia Neurological: no tremor with outstretched hands   Diabetic Foot Exam - Simple   No data filed      CMP ( most recent) CMP     Component Value Date/Time   NA 136 11/15/2021 0729   K 3.7 11/15/2021 0729   CL 102 11/15/2021 0729   CO2 21 (L) 11/15/2021 0729   GLUCOSE 310 (H) 11/15/2021 0729   BUN 12 06/02/2023 0000   CREATININE 0.7 06/02/2023 0000   CREATININE 0.44 11/15/2021 0729   CALCIUM 8.9 11/15/2021 0729   PROT 6.7 01/19/2020 2022   ALBUMIN 3.5 01/19/2020 2022   AST 36 01/19/2020 2022   ALT 25 01/19/2020 2022   ALKPHOS 64 01/19/2020 2022   BILITOT 0.4 01/19/2020 2022   EGFR 113.0 07/18/2023 1149   EGFR 110 06/02/2023 0000   GFRNONAA >60 11/15/2021 0729     Diabetic Labs (most recent): Lab Results  Component Value Date   HGBA1C 15.5 06/02/2023   HGBA1C 11.1 (H) 02/07/2020     Lipid Panel ( most recent) Lipid Panel     Component Value Date/Time   TRIG 220 (A) 06/02/2023 0000   LDLCALC 166 06/02/2023 0000      Lab Results  Component Value Date   TSH 0.49 06/02/2023           Assessment & Plan:   1) Type 1 diabetes mellitus with hyperglycemia (HCC) (Primary)  She presents today with her meter showing inconsistent glucose monitoring and gross hyperglycemia overall.  She was unable to get the Dexcom sensors to stick properly.  Her POCT A1c today is 11.5%, improving from last visit of 15.5%.  She has not missed any insulin  doses.  She does report she doesn't eat much throughout the day as she sleeps the majority of it.  She notes her fatigue is profound and her OBGYN is blaming it on her thyroid, even though it was not checked recently.  Analysis of her meter shows 7-day average of 332 with 15 readings, 14-day average of 333 with 21 readings; 30-day average of 318 with 41 readings, 90-day average of 333 with 85 readings.  - Heather Richard has currently  uncontrolled symptomatic type 1 DM since 43 years of age, with  most recent A1c of 15.5 %.   -Recent labs reviewed.  - I had a long discussion with her about the progressive nature of diabetes and the pathology behind its complications. -her diabetes is complicated by ?compliance and she remains at a high risk for more acute and chronic complications which include CAD, CVA, CKD, retinopathy, and neuropathy. These are all discussed in detail with her.  The following Lifestyle Medicine recommendations according to American College of Lifestyle Medicine [facility]) were discussed and offered to patient and she agrees to start the journey:  A. Whole Foods, Plant-based plate comprising of fruits and vegetables, plant-based proteins, whole-grain carbohydrates was discussed in detail with the patient.   A list for source of those nutrients were also provided to the patient.  Patient will use only water or unsweetened tea for hydration. B.  The need to stay away from risky substances including alcohol, smoking; obtaining 7 to 9 hours of restorative sleep, at least 150 minutes of moderate intensity exercise weekly, the importance of healthy social connections,  and stress reduction techniques were discussed. C.  A full color page of  Calorie density of various food groups per pound showing examples of each food groups was provided to the patient.  - Nutritional counseling repeated at each appointment due to patients tendency to fall back in to old habits.  - The patient admits there is a room for improvement in their diet and drink choices. -  Suggestion is made for the patient to avoid simple carbohydrates from their diet including Cakes, Sweet Desserts / Pastries, Ice Cream, Soda (diet and regular), Sweet Tea, Candies, Chips, Cookies, Sweet Pastries, Store Bought Juices, Alcohol in Excess of 1-2 drinks a day, Artificial Sweeteners, Coffee Creamer, and Sugar-free Products. This will help patient to have stable  blood glucose profile and potentially avoid unintended weight gain.   - I encouraged the patient to switch to unprocessed or minimally processed complex starch and increased protein intake (animal or plant source), fruits, and vegetables.   - Patient is advised to stick to a routine mealtimes to eat 3 meals a day and avoid unnecessary snacks (to snack only to correct hypoglycemia).  - I have approached her with the following individualized plan to manage her diabetes and patient agrees:   -Will increase Lantus  to 30 units SQ nightly and adjust her Novolog  to 8-14 units TID with meals if glucose is above 70 and she is eating (Specific instructions on how to titrate insulin  dosage based on glucose readings given to patient in writing).    -she is encouraged to continue monitoring glucose 4 times daily, before meals and before bed, and to call the clinic if she has readings less than 70 or above 300 for 3 tests in a row.  I gave her a sample of Skin Tac to try and see if it helps her Dexcom stick properly.  -She may be interested in a pump moving forward.  - she is warned not to take insulin  without proper monitoring per orders. - Adjustment parameters are given to her for hypo and hyperglycemia in writing.  -She is not a candidate for non-insulin  therapies given type 1 diagnosis.  - Specific targets for  A1c; LDL, HDL, and Triglycerides were discussed with the patient.  2) Blood Pressure /Hypertension:  her blood pressure is controlled to target.   she is advised to start her Benicar 20 mg p.o. daily with breakfast.  3) Lipids/Hyperlipidemia:    Review of her recent lipid  panel from 06/02/23 showed uncontrolled LDL at 166 and elevated triglycerides of 220 .  She is not taking her Atorvastatin, says her PCP told her not to.  Will recheck lipid panel now.  4)  Weight/Diet:  her Body mass index is 33.37 kg/m.  -  she is NOT a candidate for weight loss.  Exercise, and detailed carbohydrates  information provided  -  detailed on discharge instructions.  5) Chronic Care/Health Maintenance: -she is on ACEI/ARB and Statin medications and is encouraged to initiate and continue to follow up with Ophthalmology, Dentist, Podiatrist at least yearly or according to recommendations, and advised to stay away from smoking. I have recommended yearly flu vaccine and pneumonia vaccine at least every 5 years; moderate intensity exercise for up to 150 minutes weekly; and sleep for at least 7 hours a day.  6) Family history of thyroid dysfunction Will check comprehensive thyroid function panel for her.  She notes her mom, grandma, sister, and multiple aunts have trouble with their thyroids.  She has never had any imaging of her thyroid in the past.  Will call with results and next steps.  - she is advised to maintain close follow up with Muse, Ethelene BIRCH., PA-C for primary care needs, as well as her other providers for optimal and coordinated care.     I spent  52  minutes in the care of the patient today including review of labs from CMP, Lipids, Thyroid Function, Hematology (current and previous including abstractions from other facilities); face-to-face time discussing  her blood glucose readings/logs, discussing hypoglycemia and hyperglycemia episodes and symptoms, medications doses, her options of short and long term treatment based on the latest standards of care / guidelines;  discussion about incorporating lifestyle medicine;  and documenting the encounter. Risk reduction counseling performed per USPSTF guidelines to reduce obesity and cardiovascular risk factors.     Please refer to Patient Instructions for Blood Glucose Monitoring and Insulin /Medications Dosing Guide  in media tab for additional information. Please  also refer to  Patient Self Inventory in the Media  tab for reviewed elements of pertinent patient history.  Heather Richard participated in the discussions, expressed understanding,  and voiced agreement with the above plans.  All questions were answered to her satisfaction. she is encouraged to contact clinic should she have any questions or concerns prior to her return visit.     Follow up plan: - Return in about 3 months (around 02/13/2024) for Diabetes F/U with A1c in office, Previsit labs, Bring meter and logs.   Benton Rio, [facility] [address] [place], [place] [address] Phone: [phone] Fax: [phone]  11/13/2023, 8:40 AM

## 2023-11-13 NOTE — Addendum Note (Signed)
 Addended by: CLAUDENE ZADA POUR on: 11/13/2023 08:51 AM   Modules accepted: Orders

## 2023-11-17 ENCOUNTER — Telehealth: Payer: Self-pay

## 2023-11-17 NOTE — Telephone Encounter (Signed)
 Care everywhere search

## 2024-01-25 ENCOUNTER — Other Ambulatory Visit (HOSPITAL_COMMUNITY): Payer: Self-pay

## 2024-01-25 ENCOUNTER — Telehealth: Payer: Self-pay

## 2024-01-25 NOTE — Telephone Encounter (Signed)
 Pharmacy Patient Advocate Encounter   Received notification from Patient Advice Request messages that prior authorization for Dexcom G7 sensor is required/requested.   Insurance verification completed.   The patient is insured through [employer].   Per test claim: PA required; PA submitted to above mentioned insurance via Latent Key/confirmation #/EOC [id number] Status is pending

## 2024-01-28 NOTE — Telephone Encounter (Signed)
 Pharmacy Patient Advocate Encounter  Received notification from [employer] that Prior Authorization for Dexcom G7 sensor  has been APPROVED from 01-25-2024 to 01-24-2025   PA #/Case ID/Reference #: [id number]

## 2024-01-28 NOTE — Telephone Encounter (Signed)
 Patient called and a message was left with this information.

## 2024-02-15 ENCOUNTER — Telehealth: Payer: Self-pay | Admitting: Nurse Practitioner

## 2024-02-15 ENCOUNTER — Ambulatory Visit: Admitting: Nurse Practitioner

## 2024-02-15 DIAGNOSIS — Z8349 Family history of other endocrine, nutritional and metabolic diseases: Secondary | ICD-10-CM

## 2024-02-15 DIAGNOSIS — E782 Mixed hyperlipidemia: Secondary | ICD-10-CM

## 2024-02-15 DIAGNOSIS — Z794 Long term (current) use of insulin: Secondary | ICD-10-CM

## 2024-02-15 DIAGNOSIS — I10 Essential (primary) hypertension: Secondary | ICD-10-CM

## 2024-02-15 DIAGNOSIS — R5382 Chronic fatigue, unspecified: Secondary | ICD-10-CM

## 2024-02-15 DIAGNOSIS — E1065 Type 1 diabetes mellitus with hyperglycemia: Secondary | ICD-10-CM

## 2024-02-15 NOTE — Telephone Encounter (Signed)
 Labs should be good, just ordered in October

## 2024-02-15 NOTE — Telephone Encounter (Signed)
 Pt needs labs updated

## 2024-05-09 ENCOUNTER — Ambulatory Visit: Admitting: Nurse Practitioner
